# Patient Record
Sex: Female | Born: 1985 | Race: White | Hispanic: No | Marital: Married | Smoking: Never smoker
Health system: Southern US, Community
[De-identification: ages and names within clinical notes are randomized; demographics above are authoritative.]

## PROBLEM LIST (undated history)

## (undated) ENCOUNTER — Inpatient Hospital Stay (HOSPITAL_COMMUNITY): Payer: Self-pay

## (undated) DIAGNOSIS — N189 Chronic kidney disease, unspecified: Secondary | ICD-10-CM

## (undated) DIAGNOSIS — N200 Calculus of kidney: Secondary | ICD-10-CM

## (undated) HISTORY — PX: ECTOPIC PREGNANCY SURGERY: SHX613

---

## 2014-07-08 ENCOUNTER — Inpatient Hospital Stay (HOSPITAL_COMMUNITY)
Admission: AD | Admit: 2014-07-08 | Discharge: 2014-07-08 | Disposition: A | Discharge status: Home / Self Care | Payer: Medicaid Other | Source: Ambulatory Visit | Attending: Obstetrics & Gynecology | Admitting: Obstetrics & Gynecology

## 2014-07-08 ENCOUNTER — Encounter (HOSPITAL_COMMUNITY): Payer: Self-pay | Admitting: *Deleted

## 2014-07-08 ENCOUNTER — Inpatient Hospital Stay (HOSPITAL_COMMUNITY): Payer: Medicaid Other

## 2014-07-08 DIAGNOSIS — R109 Unspecified abdominal pain: Secondary | ICD-10-CM | POA: Insufficient documentation

## 2014-07-08 DIAGNOSIS — O209 Hemorrhage in early pregnancy, unspecified: Secondary | ICD-10-CM | POA: Diagnosis present

## 2014-07-08 DIAGNOSIS — O021 Missed abortion: Secondary | ICD-10-CM | POA: Diagnosis not present

## 2014-07-08 DIAGNOSIS — N189 Chronic kidney disease, unspecified: Secondary | ICD-10-CM | POA: Diagnosis not present

## 2014-07-08 HISTORY — DX: Calculus of kidney: N20.0

## 2014-07-08 HISTORY — DX: Chronic kidney disease, unspecified: N18.9

## 2014-07-08 LAB — URINALYSIS, ROUTINE W REFLEX MICROSCOPIC
BILIRUBIN URINE: NEGATIVE
Glucose, UA: NEGATIVE mg/dL
Ketones, ur: NEGATIVE mg/dL
Leukocytes, UA: NEGATIVE
Nitrite: NEGATIVE
Protein, ur: NEGATIVE mg/dL
SPECIFIC GRAVITY, URINE: 1.025 (ref 1.005–1.030)
Urobilinogen, UA: 0.2 mg/dL (ref 0.0–1.0)
pH: 6 (ref 5.0–8.0)

## 2014-07-08 LAB — URINE MICROSCOPIC-ADD ON

## 2014-07-08 LAB — WET PREP, GENITAL
CLUE CELLS WET PREP: NONE SEEN
TRICH WET PREP: NONE SEEN
WBC, Wet Prep HPF POC: NONE SEEN
YEAST WET PREP: NONE SEEN

## 2014-07-08 LAB — CBC
HEMATOCRIT: 39.7 % (ref 36.0–46.0)
Hemoglobin: 14 g/dL (ref 12.0–15.0)
MCH: 31 pg (ref 26.0–34.0)
MCHC: 35.3 g/dL (ref 30.0–36.0)
MCV: 87.8 fL (ref 78.0–100.0)
Platelets: 198 10*3/uL (ref 150–400)
RBC: 4.52 MIL/uL (ref 3.87–5.11)
RDW: 12.6 % (ref 11.5–15.5)
WBC: 6.7 10*3/uL (ref 4.0–10.5)

## 2014-07-08 LAB — ABO/RH: ABO/RH(D): O POS

## 2014-07-08 LAB — POCT PREGNANCY, URINE: PREG TEST UR: POSITIVE — AB

## 2014-07-08 LAB — HCG, QUANTITATIVE, PREGNANCY: hCG, Beta Chain, Quant, S: 5749 m[IU]/mL — ABNORMAL HIGH (ref <5)

## 2014-07-08 MED ORDER — PROMETHAZINE HCL 25 MG PO TABS: 25.0000 mg | ORAL_TABLET | Freq: Four times a day (QID) | ORAL | Status: DC | PRN

## 2014-07-08 MED ORDER — HYDROCODONE-ACETAMINOPHEN 5-325 MG PO TABS: 1.0000 | ORAL_TABLET | ORAL | Status: DC | PRN

## 2014-07-08 NOTE — MAU Note (Signed)
Pos HPT 3 weeks ago. [place] discharge started Saturday night,  Bleeding like a period today.  Mild cramping.  Hx of ectopic pregnancy.

## 2014-07-08 NOTE — MAU Provider Note (Signed)
History     CSN: [id number]  Arrival date and time: 07/08/14 1351   None     Chief Complaint  Patient presents with  . Vaginal Bleeding   HPI  Ms. Stephanie Marquez is a 28 y.o. female [phone] at [redacted]w[redacted]d who presents with vaginal bleeding that started 3 days ago. The bleeding started as [place] discharge, however changed to bright red bleeding today. She has had to wear and change a pad throughout the day. She is experiencing mild abdominal cramping that she describes as mild menstrual like cramps. She has a history of an ectopic pregnancy with partial salpingectomy.   OB History   Grav Para Term Preterm Abortions TAB SAB Ect Mult Living   4 2 2  1  1   2       Past Medical History  Diagnosis Date  . Chronic kidney disease   . Kidney stones     Past Surgical History  Procedure Laterality Date  . Ectopic pregnancy surgery      History reviewed. No pertinent family history.  History  Substance Use Topics  . Smoking status: Never Smoker   . Smokeless tobacco: Never Used  . Alcohol Use: No    Allergies:  Allergies  Allergen Reactions  . Bactrim [Sulfamethoxazole-Tmp Ds] Hives and Swelling    Prescriptions prior to admission  Medication Sig Dispense Refill  . Prenatal Vit-Fe Fumarate-FA (PRENATAL MULTIVITAMIN) TABS tablet Take 2 tablets by mouth daily.       Results for orders placed during the hospital encounter of 07/08/14 (from the past 48 hour(s))  POCT PREGNANCY, URINE     Status: Abnormal   Collection Time    07/08/14  2:56 PM      Result Value Ref Range   Preg Test, Ur POSITIVE (*) NEGATIVE   Comment:            THE SENSITIVITY OF THIS     METHODOLOGY IS >24 mIU/mL   Results for orders placed during the hospital encounter of 07/08/14 (from the past 48 hour(s))  URINALYSIS, ROUTINE W REFLEX MICROSCOPIC     Status: Abnormal   Collection Time    07/08/14  2:05 PM      Result Value Ref Range   Color, Urine YELLOW  YELLOW   APPearance CLEAR  CLEAR   Specific  Gravity, Urine 1.025  1.005 - 1.030   pH 6.0  5.0 - 8.0   Glucose, UA NEGATIVE  NEGATIVE mg/dL   Hgb urine dipstick LARGE (*) NEGATIVE   Bilirubin Urine NEGATIVE  NEGATIVE   Ketones, ur NEGATIVE  NEGATIVE mg/dL   Protein, ur NEGATIVE  NEGATIVE mg/dL   Urobilinogen, UA 0.2  0.0 - 1.0 mg/dL   Nitrite NEGATIVE  NEGATIVE   Leukocytes, UA NEGATIVE  NEGATIVE  URINE MICROSCOPIC-ADD ON     Status: None   Collection Time    07/08/14  2:05 PM      Result Value Ref Range   Squamous Epithelial / LPF RARE  RARE   RBC / HPF 21-50  <3 RBC/hpf   Bacteria, UA RARE  RARE  POCT PREGNANCY, URINE     Status: Abnormal   Collection Time    07/08/14  2:56 PM      Result Value Ref Range   Preg Test, Ur POSITIVE (*) NEGATIVE   Comment:            THE SENSITIVITY OF THIS     METHODOLOGY IS >24 mIU/mL  WET PREP,  GENITAL     Status: None   Collection Time    07/08/14  3:05 PM      Result Value Ref Range   Yeast Wet Prep HPF POC NONE SEEN  NONE SEEN   Trich, Wet Prep NONE SEEN  NONE SEEN   Clue Cells Wet Prep HPF POC NONE SEEN  NONE SEEN   WBC, Wet Prep HPF POC NONE SEEN  NONE SEEN  CBC     Status: None   Collection Time    07/08/14  3:08 PM      Result Value Ref Range   WBC 6.7  4.0 - 10.5 K/uL   RBC 4.52  3.87 - 5.11 MIL/uL   Hemoglobin 14.0  12.0 - 15.0 g/dL   HCT 16.1  09.6 - 04.5 %   MCV 87.8  78.0 - 100.0 fL   MCH 31.0  26.0 - 34.0 pg   MCHC 35.3  30.0 - 36.0 g/dL   RDW 40.9  81.1 - 91.4 %   Platelets 198  150 - 400 K/uL  ABO/RH     Status: None   Collection Time    07/08/14  3:08 PM      Result Value Ref Range   ABO/RH(D) O POS    HCG, QUANTITATIVE, PREGNANCY     Status: Abnormal   Collection Time    07/08/14  3:08 PM      Result Value Ref Range   hCG, Beta Chain, Quant, S 5749 (*) <5 mIU/mL   Comment:              GEST. AGE      CONC.  (mIU/mL)       <=1 WEEK        5 - 50         2 WEEKS       50 - 500         3 WEEKS       100 - 10,000         4 WEEKS     1,000 - 30,000          5 WEEKS     3,500 - 115,000       6-8 WEEKS     12,000 - 270,000        12 WEEKS     15,000 - 220,000                FEMALE AND NON-PREGNANT FEMALE:         LESS THAN 5 mIU/mL   US Ob Comp Less 14 Wks  07/08/2014   CLINICAL DATA:  28 year old female with bleeding in early pregnancy. Quantitative beta HCG 5,749. Estimated gestational age by LMP 9 weeks 5 days. Initial encounter.  EXAM: OBSTETRIC <14 WK [place] AND TRANSVAGINAL OB US  TECHNIQUE: Both transabdominal and transvaginal ultrasound examinations were performed for complete evaluation of the gestation as well as the maternal uterus, adnexal regions, and pelvic cul-de-sac. Transvaginal technique was performed to assess early pregnancy.  COMPARISON:  None.  FINDINGS: Intrauterine gestational sac: Single  Yolk sac:  Not visible  Embryo:  Visible  Cardiac Activity: Not detected  CRL:   12.7  mm   7 w 4 d  Maternal uterus/adnexae: No pelvic free fluid or subchorionic hemorrhage identified. Probable corpus luteum cyst of the right ovary (image 57) which otherwise appears normal. Normal left ovary with small follicles.  IMPRESSION: Single IUP with estimated gestational age of [redacted] weeks  and 4 days by crown-rump length (12.7 mm), but no fetal cardiac activity detected. Findings meet definitive criteria for failed pregnancy. This follows SRU consensus guidelines: Diagnostic Criteria for Nonviable Pregnancy Early in the First Trimester. Macy Mis J Med [phone].   Electronically Signed   By: Augusto Gamble M.D.   On: 07/08/2014 16:03   US Ob Transvaginal  07/08/2014   CLINICAL DATA:  28 year old female with bleeding in early pregnancy. Quantitative beta HCG 5,749. Estimated gestational age by LMP 9 weeks 5 days. Initial encounter.  EXAM: OBSTETRIC <14 WK [place] AND TRANSVAGINAL OB US  TECHNIQUE: Both transabdominal and transvaginal ultrasound examinations were performed for complete evaluation of the gestation as well as the maternal uterus, adnexal regions, and pelvic  cul-de-sac. Transvaginal technique was performed to assess early pregnancy.  COMPARISON:  None.  FINDINGS: Intrauterine gestational sac: Single  Yolk sac:  Not visible  Embryo:  Visible  Cardiac Activity: Not detected  CRL:   12.7  mm   7 w 4 d  Maternal uterus/adnexae: No pelvic free fluid or subchorionic hemorrhage identified. Probable corpus luteum cyst of the right ovary (image 57) which otherwise appears normal. Normal left ovary with small follicles.  IMPRESSION: Single IUP with estimated gestational age of [redacted] weeks and 4 days by crown-rump length (12.7 mm), but no fetal cardiac activity detected. Findings meet definitive criteria for failed pregnancy. This follows SRU consensus guidelines: Diagnostic Criteria for Nonviable Pregnancy Early in the First Trimester. Macy Mis J Med [phone].   Electronically Signed   By: Augusto Gamble M.D.   On: 07/08/2014 16:03    Review of Systems  Constitutional: Negative for fever and chills.  Gastrointestinal: Positive for abdominal pain (Lower abdominal cramping ). Negative for nausea, vomiting, diarrhea and constipation.  Genitourinary: Negative for dysuria, urgency, frequency and hematuria.       No vaginal discharge. + vaginal bleeding; period like bleeding  No dysuria.   Musculoskeletal: Negative for back pain.   Physical Exam   Blood pressure 104/74, pulse 88, temperature 98.4 F (36.9 C), temperature source Oral, resp. rate 18, last menstrual period 05/01/2014.  Physical Exam  Constitutional: She is oriented to person, place, and time. She appears well-developed and well-nourished. No distress.  HENT:  Head: Normocephalic.  Eyes: Pupils are equal, round, and reactive to light.  Neck: Neck supple.  Respiratory: Effort normal.  GI: Soft. She exhibits no distension. There is no tenderness. There is no rebound and no guarding.  Genitourinary:  Speculum exam: Vagina - Moderate amount of dark red blood in vaginal canal.  Cervix - +active  bleeding from cervix.  Bimanual exam: Cervix slightly open. Uterus non tender, enlarged  Adnexa non tender, no masses bilaterally GC/Chlam, wet prep done Chaperone present for exam.   Musculoskeletal: Normal range of motion.  Neurological: She is alert and oriented to person, place, and time.  Skin: Skin is warm. She is not diaphoretic.  Psychiatric: Her behavior is normal.    MAU Course  Procedures None  MDM CBC ABO Beta hcg [place] Wet prep GC  Discussed findings with Dr. Debroah Loop. I will offer patient cytotec vs expectant management.  Pt will do expectant management and follow up at [facility] in 1 week. Pt is aware that she may require further intervention.  O positive blood type   Assessment and Plan   A:  Missed AB  P:  Discharge home in stable condition RX: Vicodin         Phenergan  Bleeding precautions discussed Pt is to schedule a follow up appointment for 1 week. Pelvic rest  Support given    Iona HansenJennifer Irene Neng Albee, NP 07/08/2014, 2:51 PM

## 2014-07-09 LAB — GC/CHLAMYDIA PROBE AMP
CT PROBE, AMP APTIMA: NEGATIVE
GC Probe RNA: NEGATIVE

## 2014-07-16 ENCOUNTER — Ambulatory Visit (INDEPENDENT_AMBULATORY_CARE_PROVIDER_SITE_OTHER): Payer: Medicaid Other | Admitting: Obstetrics & Gynecology

## 2014-07-16 ENCOUNTER — Encounter: Payer: Self-pay | Admitting: Obstetrics & Gynecology

## 2014-07-16 VITALS — BP 113/77 | HR 83 | Ht 61.0 in | Wt 143.6 lb

## 2014-07-16 DIAGNOSIS — O039 Complete or unspecified spontaneous abortion without complication: Secondary | ICD-10-CM

## 2014-07-16 NOTE — Progress Notes (Signed)
Patient bled up until two days ago.  She did bleed a moderate amount of blood with pretty severe cramping in her back.  She was seen in MAU last week and was told to follow up with us.  She opted for expectant management.

## 2014-07-16 NOTE — Progress Notes (Signed)
Diagnosed with 7 week MAB on 07/08/14, opted for expectant management.  Had moderate bleeding, feels that she passed the pregnancy.  No concerns. Appropriate support and empathy given to patient.  Completed SAB confirmed on clinic scan on 07/16/14. Declines contraception for now; uses rhythm method. Will continue to use PNV and avoid teratogens, plans to conceive in 2-3 months. Has appropriate support from family and church members; declines any additional help No other concerns. Will call for any concerns or when she conceives again.

## 2014-07-16 NOTE — Patient Instructions (Signed)
Return to clinic for any obstetric concerns or go to MAU for evaluation  

## 2014-09-24 IMAGING — US US OB COMP LESS 14 WK
1 series · 13 of 28 positions shown · non-contrast
Comparison: None.

CLINICAL DATA: 28-year-old female with bleeding in early pregnancy.
Quantitative beta HCG [DATE]. Estimated gestational age by LMP 9
weeks 5 days. Initial encounter.

EXAM:
OBSTETRIC <14 WK US AND TRANSVAGINAL OB US
TECHNIQUE: Both transabdominal and transvaginal ultrasound examinations were
performed for complete evaluation of the gestation as well as the
maternal uterus, adnexal regions, and pelvic cul-de-sac.
Transvaginal technique was performed to assess early pregnancy.

[Series 1: us ob comp less 14 wks · 56 acquisitions, 13 frames shown]
[im 3/56]
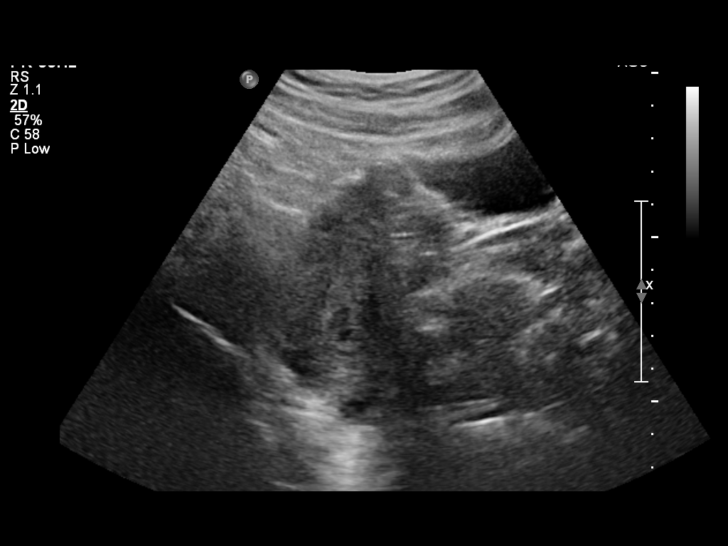
[im 7/56]
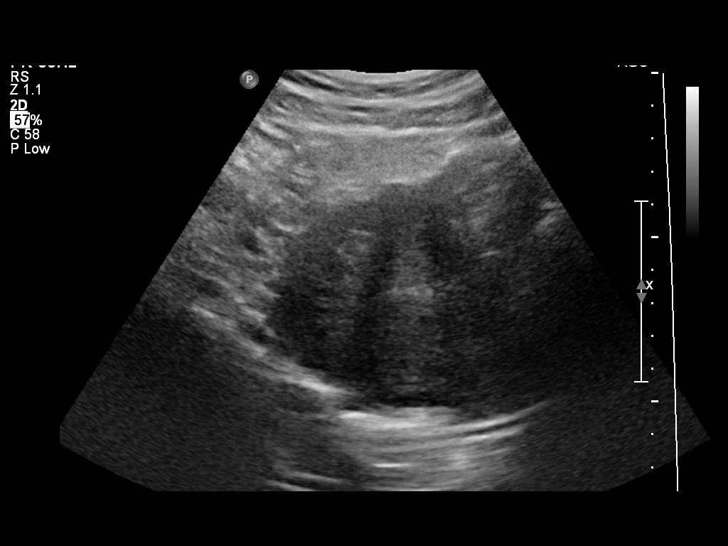
[im 11/56]
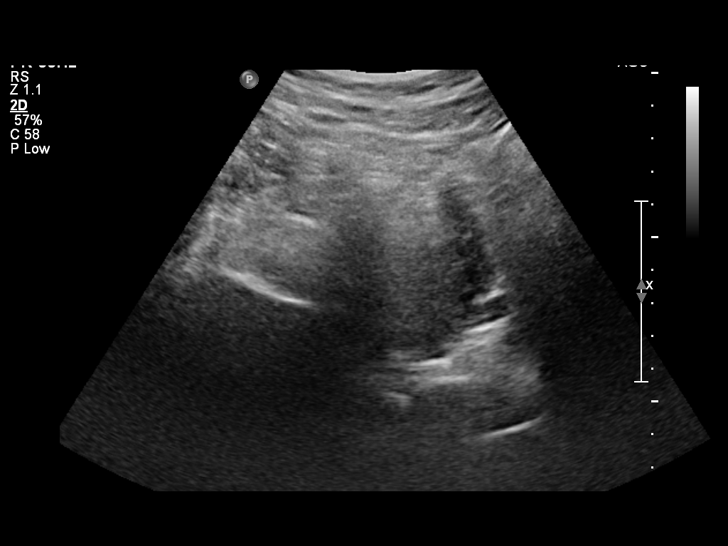
[im 15/56]
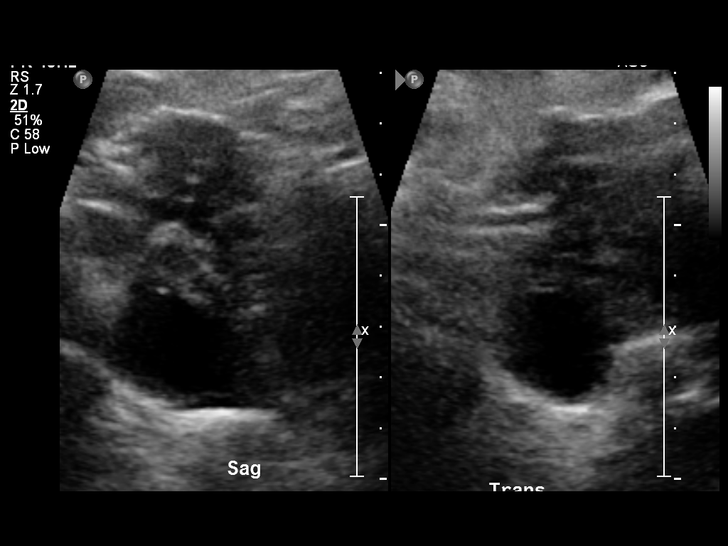
[im 19/56]
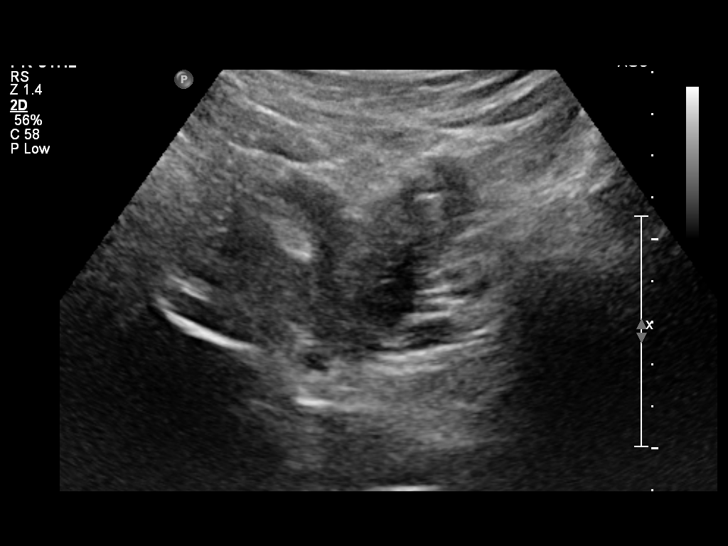
[im 23/56]
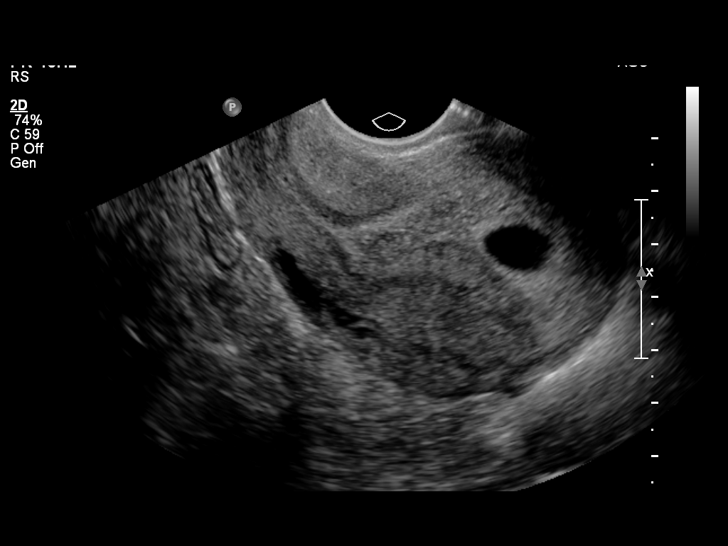
[im 29/56]
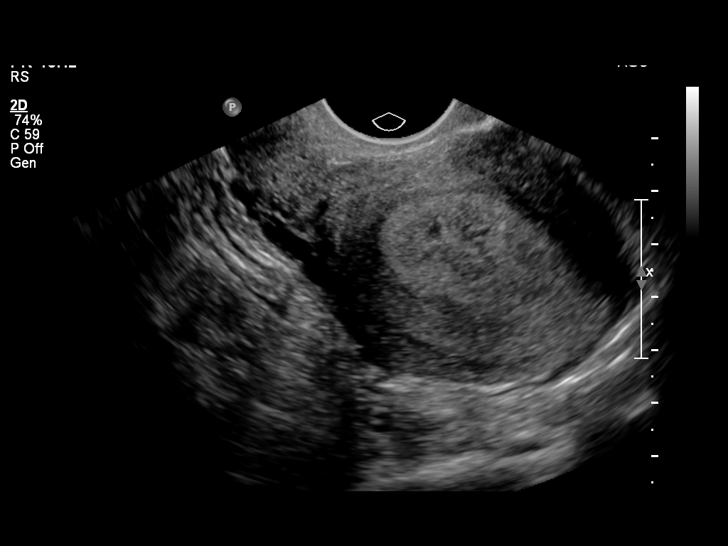
[im 33/56]
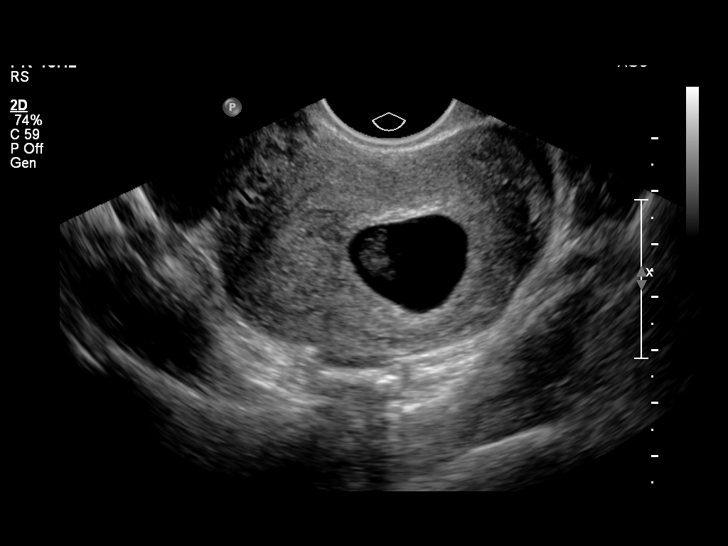
[im 37/56]
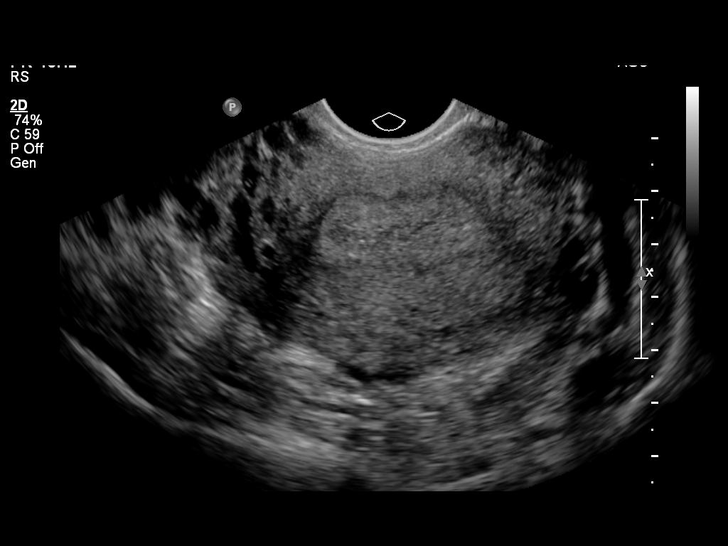
[im 41/56]
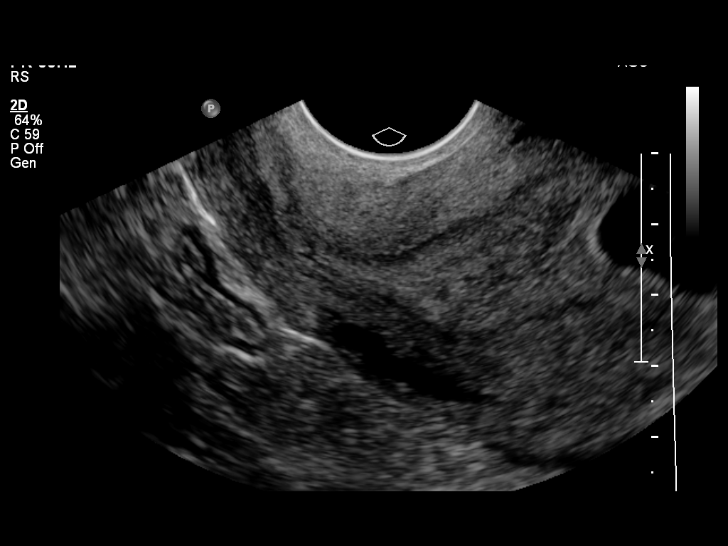
[im 45/56]
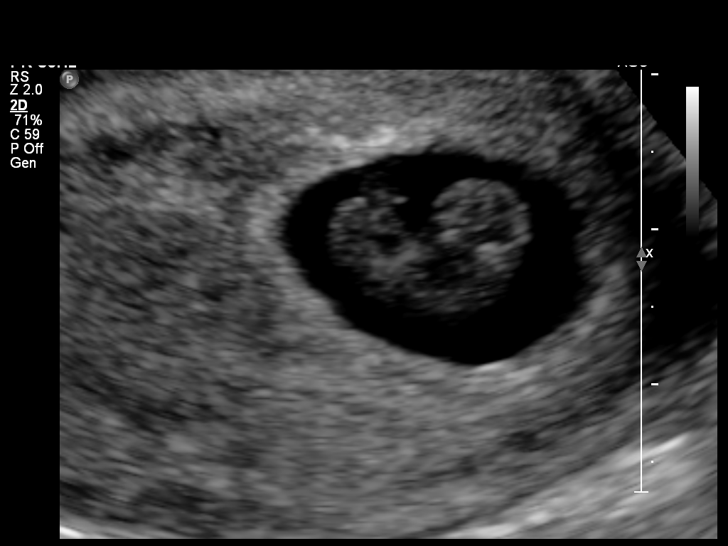
[im 49/56]
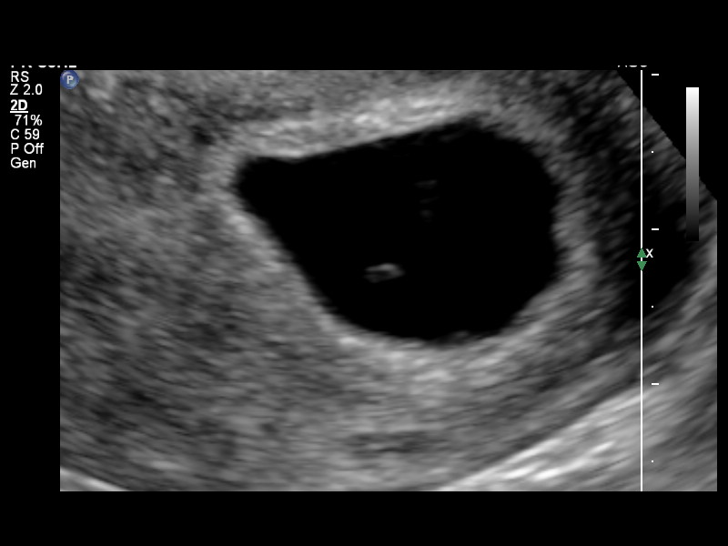
[im 53/56]
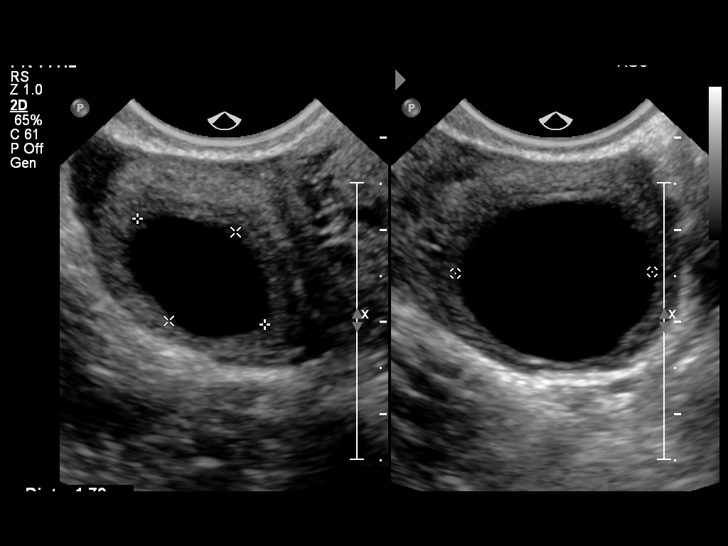

[13 of 28 positions shown; findings below may reference images not displayed]

FINDINGS: Intrauterine gestational sac: Single

Yolk sac:  Not visible

Embryo:  Visible

Cardiac Activity: Not detected

CRL:   12.7  mm   7 w 4 d

Maternal uterus/adnexae: No pelvic free fluid or subchorionic
hemorrhage identified. Probable corpus luteum cyst of the right
ovary (image 57) which otherwise appears normal. Normal left ovary
with small follicles.
IMPRESSION: Single IUP with estimated gestational age of 7 weeks and 4 days by
crown-rump length (12.7 mm), but no fetal cardiac activity detected.
Findings meet definitive criteria for failed pregnancy. This follows
SRU consensus guidelines: Diagnostic Criteria for Nonviable
Pregnancy Early in the First Trimester. N Engl J Med

## 2014-10-01 ENCOUNTER — Encounter: Payer: Self-pay | Admitting: Obstetrics & Gynecology

## 2015-04-17 ENCOUNTER — Encounter: Payer: Self-pay | Admitting: Obstetrics and Gynecology

## 2015-04-17 ENCOUNTER — Ambulatory Visit (INDEPENDENT_AMBULATORY_CARE_PROVIDER_SITE_OTHER): Payer: Medicaid Other | Admitting: Obstetrics and Gynecology

## 2015-04-17 ENCOUNTER — Other Ambulatory Visit (HOSPITAL_COMMUNITY)
Admission: RE | Admit: 2015-04-17 | Discharge: 2015-04-17 | Disposition: A | Discharge status: Home / Self Care | Payer: Medicaid Other | Source: Ambulatory Visit | Attending: Obstetrics and Gynecology | Admitting: Obstetrics and Gynecology

## 2015-04-17 VITALS — BP 118/76 | HR 114 | Ht 61.0 in | Wt 148.0 lb

## 2015-04-17 DIAGNOSIS — Z0000 Encounter for general adult medical examination without abnormal findings: Secondary | ICD-10-CM | POA: Diagnosis not present

## 2015-04-17 DIAGNOSIS — Z01419 Encounter for gynecological examination (general) (routine) without abnormal findings: Secondary | ICD-10-CM | POA: Insufficient documentation

## 2015-04-17 DIAGNOSIS — Z124 Encounter for screening for malignant neoplasm of cervix: Secondary | ICD-10-CM

## 2015-04-17 NOTE — Progress Notes (Signed)
**Note Stephanie-Identified via Obfuscation**   Subjective:     Stephanie Marquez is a 29 y.o. female [phone] with LMP 04/12/2015 and BMI 27 who is here for a comprehensive physical exam. The patient reports no problems. Patient is sexually active using the rhythm method. She recently lost her mother to ovarian cancer in April 2016. She was diagnosed at the age of 34. No other family history of malignancy.  History   Social History  . Marital Status: Married    Spouse Name: N/A  . Number of Children: N/A  . Years of Education: N/A   Occupational History  . Not on file.   Social History Main Topics  . Smoking status: Never Smoker   . Smokeless tobacco: Never Used  . Alcohol Use: No  . Drug Use: No  . Sexual Activity: Yes    Birth Control/ Protection: None   Other Topics Concern  . Not on file   Social History Narrative   Health Maintenance  Topic Date Due  . HIV Screening  03/15/2001  . PAP SMEAR  03/15/2004  . TETANUS/TDAP  03/15/2005  . INFLUENZA VACCINE  06/30/2015   Past Medical History  Diagnosis Date  . Chronic kidney disease   . Kidney stones    Past Surgical History  Procedure Laterality Date  . Ectopic pregnancy surgery     History reviewed. No pertinent family history.    Review of Systems Pertinent items are noted in HPI.   Objective:      GENERAL: Well-developed, well-nourished female in no acute distress.  HEENT: Normocephalic, atraumatic. Sclerae anicteric.  NECK: Supple. Normal thyroid.  LUNGS: Clear to auscultation bilaterally.  HEART: Regular rate and rhythm. BREASTS: Symmetric in size. No palpable masses or lymphadenopathy, skin changes, or nipple drainage. ABDOMEN: Soft, nontender, nondistended. No organomegaly. PELVIC: Normal external female genitalia. Vagina is pink and rugated.  Normal discharge. Normal appearing cervix. Uterus is normal in size. No adnexal mass or tenderness. EXTREMITIES: No cyanosis, clubbing, or edema, 2+ distal pulses.    Assessment:    Healthy female exam.       Plan:    Pap smear collected Patient advised to start monthly self breast exam Patient will be contacted with any abnormal results Information on BRCA testing provided although patient is low risk See After Visit Summary for Counseling Recommendations

## 2015-04-21 LAB — CYTOLOGY - PAP

## 2016-11-30 ENCOUNTER — Ambulatory Visit (INDEPENDENT_AMBULATORY_CARE_PROVIDER_SITE_OTHER): Payer: Self-pay | Admitting: Certified Nurse Midwife

## 2016-11-30 ENCOUNTER — Encounter: Payer: Self-pay | Admitting: Certified Nurse Midwife

## 2016-11-30 VITALS — BP 79/46 | HR 88 | Ht 61.0 in | Wt 126.3 lb

## 2016-11-30 DIAGNOSIS — N926 Irregular menstruation, unspecified: Secondary | ICD-10-CM

## 2016-11-30 DIAGNOSIS — Z3201 Encounter for pregnancy test, result positive: Secondary | ICD-10-CM

## 2016-11-30 LAB — POCT URINE PREGNANCY: Preg Test, Ur: POSITIVE — AB

## 2016-11-30 NOTE — Patient Instructions (Signed)

## 2016-11-30 NOTE — Progress Notes (Signed)
GYN ENCOUNTER NOTE  Subjective:       Stephanie Marquez is a 31 y.o. [phone] female is here for gynecologic evaluation of the following issues: positive home pregnancy test and missed menses.   She reports morning sickness symptoms consistent with her two previous pregnancy; nausea and occasional vomiting that does not require medications.   [place] also has experienced some general fatigue and breast tenderness, but she is not sure if the fatigue is related to pregnancy or raising two small children.   Delynn, her husband, and her two small children (six and four) relocated from [place] for her husband job with [employer]   They are excited about this pregnancy and midwifery care.    Gynecologic History Patient's last menstrual period was 10/04/2016 (exact date). Menstrual cycles are regular.   EDD: 07/11/2017.  Gestational age:  8 weeks 1 day  Contraception: none   Last Pap: 03/2015. Results were: normal   Obstetric History OB History  Gravida Para Term Preterm AB Living  6 2 2   1 2   SAB TAB Ectopic Multiple Live Births  0   1   2    # Outcome Date GA Lbr Len/2nd Weight Sex Delivery Anes PTL Lv  6 Current           5 Term 11/30/12    Genella Mech EPI  LIV  4 Ectopic 2013          3 Term 08/13/10    M Vag-Spont EPI  LIV  2 Gravida           1 Gravida               Past Medical History:  Diagnosis Date  . Chronic kidney disease   . Kidney stones     Past Surgical History:  Procedure Laterality Date  . ECTOPIC PREGNANCY SURGERY      No current outpatient prescriptions on file prior to visit.   No current facility-administered medications on file prior to visit.     Allergies  Allergen Reactions  . Bactrim [Sulfamethoxazole-Trimethoprim] Hives and Swelling    Social History   Social History  . Marital status: Married    Spouse name: N/A  . Number of children: N/A  . Years of education: N/A   Occupational History  . Not on file.   Social History  Main Topics  . Smoking status: Never Smoker  . Smokeless tobacco: Never Used  . Alcohol use No  . Drug use: No  . Sexual activity: Yes    Birth control/ protection: None   Other Topics Concern  . Not on file   Social History Narrative  . No narrative on file    Family History  Problem Relation Age of Onset  . Ovarian cancer Mother   . Rheum arthritis Father     The following portions of the patient's history were reviewed and updated as appropriate: allergies, current medications, past family history, past medical history, past social history, past surgical history and problem list.  Review of Systems Review of Systems - negative except for as noted above  Objective:   BP (!) 79/46   Pulse 88   Ht 5\' 1"  (1.549 m)   Wt 126 lb 5 oz (57.3 kg)   LMP 10/04/2016 (Exact Date)   BMI 23.87 kg/m  CONSTITUTIONAL: Well-developed female in no acute distress.   LUNGS: CTAB  HEART: RRR  ABDOMEN: Soft, round, and non-tender  SKIN: clean, dry, and intact. No  rashes or lesions noted   Assessment:   1. Positive pregnancy test  - Beta HCG, Quant - US OB Comp Less 14 Wks; Future - POCT urine pregnancy  2. Missed menses  - Beta HCG, Quant - US OB Comp Less 14 Wks; Future - POCT urine pregnancy     Plan:   RTC x 1-2 weeks for viability US and nurse intake.  RTC sooner if needed.  Reviewed red flag symptoms and reasons to call.  Continue taking PNV w/folic acid and DHA.    Gunnar BullaJenkins Michelle Nikkole Placzek, CNM

## 2016-11-30 NOTE — Progress Notes (Signed)
Pt had a positive home pregnancy test. Also has a history of ectopic right side 2013, miscarriage at 10 weeks in 2016.C/o n/v

## 2016-12-01 LAB — BETA HCG QUANT (REF LAB): hCG Quant: 46444 m[IU]/mL

## 2016-12-03 NOTE — Addendum Note (Signed)
Addended by: Shaune SpittleLAWHORN, Antoinette Haskett M on: 12/03/2016 08:07 AM   Modules accepted: Orders

## 2016-12-07 ENCOUNTER — Ambulatory Visit (INDEPENDENT_AMBULATORY_CARE_PROVIDER_SITE_OTHER): Payer: Self-pay

## 2016-12-07 ENCOUNTER — Ambulatory Visit (INDEPENDENT_AMBULATORY_CARE_PROVIDER_SITE_OTHER): Payer: Self-pay | Admitting: Certified Nurse Midwife

## 2016-12-07 ENCOUNTER — Encounter: Payer: Self-pay | Admitting: Certified Nurse Midwife

## 2016-12-07 ENCOUNTER — Other Ambulatory Visit: Payer: Self-pay | Admitting: Certified Nurse Midwife

## 2016-12-07 VITALS — BP 110/68 | HR 71 | Wt 146.2 lb

## 2016-12-07 DIAGNOSIS — Z1389 Encounter for screening for other disorder: Secondary | ICD-10-CM

## 2016-12-07 DIAGNOSIS — N926 Irregular menstruation, unspecified: Secondary | ICD-10-CM

## 2016-12-07 DIAGNOSIS — Z369 Encounter for antenatal screening, unspecified: Secondary | ICD-10-CM

## 2016-12-07 DIAGNOSIS — Z113 Encounter for screening for infections with a predominantly sexual mode of transmission: Secondary | ICD-10-CM

## 2016-12-07 DIAGNOSIS — R935 Abnormal findings on diagnostic imaging of other abdominal regions, including retroperitoneum: Secondary | ICD-10-CM

## 2016-12-07 DIAGNOSIS — Z3201 Encounter for pregnancy test, result positive: Secondary | ICD-10-CM

## 2016-12-07 DIAGNOSIS — Z3481 Encounter for supervision of other normal pregnancy, first trimester: Secondary | ICD-10-CM

## 2016-12-07 NOTE — Progress Notes (Signed)
Patient ID: Stephanie RogerKaty Marquez, female   DOB: [birth year], 31 y.o.   MRN: [id number]  Pt presents for ultrasound for viability and dating before her NOB Nurse Intake visit. Ultrasound results showed GA-6 wks, no fht, with irregular, thickened tissue surrounding pregnancy. BHCG: 46,444 on 11/30/2016 (6-8wks)  and had positive pregnancy test. Pt will see MJL today to discuss ultrasound results.

## 2016-12-07 NOTE — Progress Notes (Signed)
ULTRASOUND REPORT  Location: [facility] Date of Service: 12/07/16  Indications:Viability Findings:  Mason JimSingleton intrauterine pregnancy is visualized with a CRL consistent with 6 1/[redacted] weeks gestation, giving an (U/S) EDD of 08/01/17. The (U/S) EDD is NOT consistent with the clinically established (LMP) EDD of 07/11/17.  FHR: no Heart beat seen CRL measurement: 4.2 mm Yolk sac and and early anatomy is abnormal.  Tissue surrounding gestational sac appears thickened and abnormal.   Right Ovary measures 2.4 x 1.2 x 1.3 cm. It is normal in appearance. Left Ovary measures 4.5 x 3.2 x 3.5 cm. Simple Cyst on left ovary 3 x 2.4 x 3.1 cm. There is evidence of a corpus luteal cyst in the Left Survey of the adnexa demonstrates no adnexal masses. There is no free peritoneal fluid in the cul de sac.  Impression: 1. 6 1/7  week Singleton Intrauterine pregnancy by U/S. 2. (U/S) EDD is NOT consistent with Clinically established (LMP) EDD of 07/11/17.  Recommendations: 1.Clinical correlation with the patient's History and Physical Exam.  Boyce MediciMaria E Hill   Plan: 1. US findings reviewed with pt, verbalized understanding.   2. Will draw a repeat Beta HCG today (12/07/2016) and f/u with patient if repeat US needed.   3. Reviewed bleeding precautions and when to call   Gunnar BullaJenkins Michelle Mi Balla, CNM  A total of 10 minutes were spent face-to-face with the patient during this encounter and over half of that time dealt with counseling and coordination of care.

## 2016-12-08 LAB — ANTIBODY SCREEN: ANTIBODY SCREEN: NEGATIVE

## 2016-12-08 LAB — BETA HCG QUANT (REF LAB): hCG Quant: 51882 m[IU]/mL

## 2016-12-08 LAB — RH TYPE: Rh Factor: POSITIVE

## 2016-12-08 LAB — ABO

## 2016-12-09 ENCOUNTER — Telehealth: Payer: Self-pay | Admitting: Certified Nurse Midwife

## 2016-12-09 DIAGNOSIS — N926 Irregular menstruation, unspecified: Secondary | ICD-10-CM

## 2016-12-09 NOTE — Telephone Encounter (Signed)
Called pt, verified full name and date of birth.   Lab results given. Discussed expected outcome vs actual outcome.   Repeat beta HCG and US on 12/14/2016.  Bleeding precautions given, pt verbalized understanding.   Call back with further needs, questions, or concerns.    Gunnar BullaJenkins Michelle Jameika Kinn, CNM

## 2016-12-14 ENCOUNTER — Other Ambulatory Visit: Payer: Self-pay | Admitting: Certified Nurse Midwife

## 2016-12-14 ENCOUNTER — Ambulatory Visit (INDEPENDENT_AMBULATORY_CARE_PROVIDER_SITE_OTHER): Payer: Self-pay

## 2016-12-14 ENCOUNTER — Ambulatory Visit (INDEPENDENT_AMBULATORY_CARE_PROVIDER_SITE_OTHER): Payer: Self-pay | Admitting: Certified Nurse Midwife

## 2016-12-14 ENCOUNTER — Encounter: Payer: Self-pay | Admitting: Certified Nurse Midwife

## 2016-12-14 VITALS — BP 88/46 | HR 84 | Wt 146.4 lb

## 2016-12-14 DIAGNOSIS — N926 Irregular menstruation, unspecified: Secondary | ICD-10-CM

## 2016-12-14 DIAGNOSIS — O021 Missed abortion: Secondary | ICD-10-CM

## 2016-12-14 DIAGNOSIS — Z3201 Encounter for pregnancy test, result positive: Secondary | ICD-10-CM

## 2016-12-14 LAB — POCT URINALYSIS DIPSTICK
Bilirubin, UA: NEGATIVE
GLUCOSE UA: NEGATIVE
Ketones, UA: NEGATIVE
Leukocytes, UA: NEGATIVE
NITRITE UA: NEGATIVE
PROTEIN UA: NEGATIVE
SPEC GRAV UA: 1.015
UROBILINOGEN UA: NEGATIVE
pH, UA: 6.5

## 2016-12-14 MED ORDER — MISOPROSTOL 200 MCG PO TABS: ORAL_TABLET | ORAL | 1 refills | Status: DC

## 2016-12-14 NOTE — Progress Notes (Signed)
OB/GYN CONFERENCE NOTE:  Subjective:       Stephanie Marquez is a 31 y.o. [phone] female who presents for review of repeat US and lab work.   Pt seen two (2) weeks ago for evaluation of missed menses and positive home pregnancy test. Estreya was scheduled for a dating and viability [place] and Beta HCG due to her history of miscarriage.   Initial US revealed a gestational sac dating 6 weeks 1 day with abnormal tissue surrounding and no fetal heart beat. Beta HCG showed less than adequate increase.   Results reviewed with patient and decision was made to repeating testing x 1 week.   Denies difficulty breathing or respiratory distress, fever, chest pain, abdominal pain, vaginal bleeding, and leg pain or swelling.   Gynecologic History  Patient's last menstrual period was 10/04/2016 (exact date).   Contraception: none  Obstetric History OB History  Gravida Para Term Preterm AB Living  6 2 2   1 2   SAB TAB Ectopic Multiple Live Births  0   1   2    # Outcome Date GA Lbr Len/2nd Weight Sex Delivery Anes PTL Lv  6 Current           5 Term 11/30/12    Stephanie Marquez EPI  LIV  4 Ectopic 2013          3 Term 08/13/10    M Vag-Spont EPI  LIV  2 Gravida           1 Gravida               Past Medical History:  Diagnosis Date  . Chronic kidney disease   . Kidney stones     Past Surgical History:  Procedure Laterality Date  . ECTOPIC PREGNANCY SURGERY      Current Outpatient Prescriptions on File Prior to Visit  Medication Sig Dispense Refill  . Prenatal Vit-Fe Fumarate-FA (PRENATAL MULTIVITAMIN) TABS tablet Take 1 tablet by mouth daily at 12 noon.     No current facility-administered medications on file prior to visit.     Allergies  Allergen Reactions  . Bactrim [Sulfamethoxazole-Trimethoprim] Hives and Swelling    Social History   Social History  . Marital status: Married    Spouse name: N/A  . Number of children: N/A  . Years of education: N/A   Occupational History  . Not  on file.   Social History Main Topics  . Smoking status: Never Smoker  . Smokeless tobacco: Never Used  . Alcohol use No  . Drug use: No  . Sexual activity: Yes    Birth control/ protection: None   Other Topics Concern  . Not on file   Social History Narrative  . No narrative on file    Family History  Problem Relation Age of Onset  . Ovarian cancer Mother   . Rheum arthritis Father     The following portions of the patient's history were reviewed and updated as appropriate: allergies, current medications, past family history, past medical history, past social history, past surgical history and problem list.  Review of Systems  Pertinent items are noted in HPI.   Objective:   BP (!) 88/46   Pulse 84   Wt 146 lb 6 oz (66.4 kg)   LMP 10/04/2016 (Exact Date)   BMI 27.66 kg/m   ULTRASOUND REPORT  Location: [facility] Date of Service: 12/14/16  Indications:Threatened AB Findings:  Singleton intrauterine pregnancy is visualized with a CRL consistent  with 6 1/[redacted] weeks gestation, giving an (U/S) EDD of 08/08/17. The (U/S) EDD is NOT consistent with the clinically established (LMP) EDD of 07/11/17.  Pregnancy appear unchanged since scan on 12-07-16.  Possible missed abortion.  FHR: no heart beat seen CRL measurement: 4 mm Yolk sac and and early anatomy is abnormal in appearance.  Right Ovary measures 2.3 x 1.1 x 1 cm. It is normal in appearance. Left Ovary measures 4.1 x 2.8 x 3.9 cm. Simple cyst on left ovary measures 3 x 2.6 x 3.1 cm. There is evidence of a corpus luteal cyst in the Left Survey of the adnexa demonstrates no adnexal masses. There is no free peritoneal fluid in the cul de sac.  Impression: 1. 6 1/7 week NON-Viable Singleton Intrauterine pregnancy by U/S. 2. (U/S) EDD is NOT consistent with Clinically established (LMP) EDD of 07/11/17. 3.  Abnormal appearing pregnancy  Assessment   Missed abortion   Plan   1. Reviewed US report and  treatment options as listed below:   SURGERY (D&E) . Procedure over in 1 day . Requires being put to sleep . Bleeding may be light . Possible problems during surgery, including injury to womb(uterus) . Care provider has more control Medicine (CYTOTEC) . The complete procedure may take days to weeks . No Surgery . Bleeding may be heavy at times . There may be drug side effects . Patient has more control Waiting . You may choose to wait, in which case your own body may complete the passing of the abnormal early pregnancy on its own in about 2-4 weeks . Your bleeding may be heavy at times . There is a small possibility that you may need surgery if the bleeding is too much or not all of the pregnancy has passed.  2. Repeat US and Beta HCG x 1 week.  3. After discussing treatment options, Stephanie Marquez would like to wait for the pregnancy to pass without intervention. Strict instructions on temperature monitoring and when to call given, pt verbalized understanding.   4. Misoprostol Rx to pharmacy, pt to call if she decides to start taking the medication.  5. Condolences given   Stephanie Marquez Stephanie Marquez, CNM

## 2016-12-14 NOTE — Progress Notes (Signed)
Pt is here to followup on labs and U/S.

## 2016-12-15 LAB — BETA HCG QUANT (REF LAB): HCG QUANT: 33797 m[IU]/mL

## 2016-12-21 ENCOUNTER — Ambulatory Visit (INDEPENDENT_AMBULATORY_CARE_PROVIDER_SITE_OTHER): Payer: Self-pay

## 2016-12-21 ENCOUNTER — Ambulatory Visit (INDEPENDENT_AMBULATORY_CARE_PROVIDER_SITE_OTHER): Payer: Self-pay | Admitting: Certified Nurse Midwife

## 2016-12-21 VITALS — BP 116/78 | HR 81 | Ht 61.0 in | Wt 145.4 lb

## 2016-12-21 DIAGNOSIS — O021 Missed abortion: Secondary | ICD-10-CM

## 2016-12-21 DIAGNOSIS — N926 Irregular menstruation, unspecified: Secondary | ICD-10-CM

## 2016-12-21 NOTE — Progress Notes (Signed)
GYN ENCOUNTER NOTE  Subjective:       Stephanie Marquez is a 31 y.o. [phone] female who presents for review of repeat ultrasound.   Pt seen last week for repeat US and blood work due to non viable pregnancy and missed ab. Stephanie Marquez opted for expected management after all options were discussed.   Denies difficulty breathing or respiratory distress, fever, chest pain, abdominal pain, vaginal bleeding, and leg pain or swelling.    Gynecologic History  Patient's last menstrual period was 10/04/2016 (exact date).   Contraception: none  Obstetric History OB History  Gravida Para Term Preterm AB Living  6 2 2   1 2   SAB TAB Ectopic Multiple Live Births  0   1   2    # Outcome Date GA Lbr Len/2nd Weight Sex Delivery Anes PTL Lv  6 Current           5 Term 11/30/12    Genella MechM Vag-Spont EPI  LIV  4 Ectopic 2013          3 Term 08/13/10    M Vag-Spont EPI  LIV  2 Gravida           1 Gravida               Past Medical History:  Diagnosis Date  . Chronic kidney disease   . Kidney stones     Past Surgical History:  Procedure Laterality Date  . ECTOPIC PREGNANCY SURGERY      Current Outpatient Prescriptions on File Prior to Visit  Medication Sig Dispense Refill  . misoprostol (CYTOTEC) 200 MCG tablet Place four tablets in between your gums and cheeks (two tablets on each side) as instructed OR insert four tablets vaginally 4 tablet 1  . Prenatal Vit-Fe Fumarate-FA (PRENATAL MULTIVITAMIN) TABS tablet Take 1 tablet by mouth daily at 12 noon.     No current facility-administered medications on file prior to visit.     Allergies  Allergen Reactions  . Bactrim [Sulfamethoxazole-Trimethoprim] Hives and Swelling    Social History   Social History  . Marital status: Married    Spouse name: N/A  . Number of children: N/A  . Years of education: N/A   Occupational History  . Not on file.   Social History Main Topics  . Smoking status: Never Smoker  . Smokeless tobacco: Never Used  .  Alcohol use No  . Drug use: No  . Sexual activity: Yes    Birth control/ protection: None   Other Topics Concern  . Not on file   Social History Narrative  . No narrative on file    Family History  Problem Relation Age of Onset  . Ovarian cancer Mother   . Rheum arthritis Father     The following portions of the patient's history were reviewed and updated as appropriate: allergies, current medications, past family history, past medical history, past social history, past surgical history and problem list.  Review of Systems  Review of Systems - Negative except as mentioned above History obtained from the patient    Objective:   BP 116/78 (BP Location: Left Arm, Patient Position: Sitting, Cuff Size: Normal)   Pulse 81   Ht 5\' 1"  (1.549 m)   Wt 145 lb 6.4 oz (66 kg)   LMP 10/04/2016 (Exact Date)   BMI 27.47 kg/m    ULTRASOUND REPORT  Location: [facility] Date of Service: 12/21/16  Indications:Threatened AB Findings:  Singleton intrauterine pregnancy is visualized  with a CRL consistent with [redacted] weeks gestation, giving an (U/S) EDD of 08/16/17. The (U/S) EDD is NOT consistent with the clinically established (LMP) EDD of 07/11/17. This is unchanged from previous US.  FHR: no heart beat seen CRL measurement: 3.3 mm Yolk sac and and early anatomy is normal.  Right Ovary measures 2.5 x 1.1 x 1.7 cm. It is normal in appearance. Left Ovary measures 4.2 x 3.1 x 2.5 cm. Left ovarian cyst measures 2.6 x 1.9 x 2.5 cm. There is evidence of a corpus luteal cyst in the Left Survey of the adnexa demonstrates left adnexal varices. There is no free peritoneal fluid in the cul de sac.  Impression: 1. 6 week NON Viable Singleton Intrauterine pregnancy by U/S. 2. (U/S) EDD is NOT consistent with Clinically established (LMP) EDD of 07/11/17. 3. 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  Assessment:   1. Missed abortion   Plan:   1. Reviewed [place] report and treatment options  listed below: SURGERY (D&E)  Procedure over in 1 day  Requires being put to sleep  Bleeding may be light  Possible problems during surgery, including injury to womb(uterus)  Care provider has more control Medicine (CYTOTEC)  The complete procedure may take days to weeks  No Surgery  Bleeding may be heavy at times  There may be drug side effects  Patient has more control Waiting  You may choose to wait, in which case your own body may complete the passing of the abnormal early pregnancy on its own in about 2-4 weeks  Your bleeding may be heavy at times  There is a small possibility that you may need surgery if the bleeding is too much or not all of the pregnancy has passed.  2. Repeat US and Beta HCG x 1 week.   3. After discussing treatment options, Stephanie Marquez would like to move ahead with the Cytotec. Treatment regimen, side effect management and red flag symptoms reviewed. Handout given.  4. Cytotec Rx sent to pharmacy last week.   5. Call with further needs, questions, or concerns.    Stephanie Marquez, CNM

## 2016-12-26 ENCOUNTER — Encounter
Admission: EM | Disposition: A | Discharge status: Home / Self Care | Payer: Self-pay | Source: Home / Self Care | Attending: Emergency Medicine

## 2016-12-26 ENCOUNTER — Emergency Department
Admission: EM | Admit: 2016-12-26 | Discharge: 2016-12-27 | Disposition: A | Discharge status: Home / Self Care | Payer: Self-pay | Attending: Emergency Medicine | Admitting: Emergency Medicine

## 2016-12-26 DIAGNOSIS — O034 Incomplete spontaneous abortion without complication: Secondary | ICD-10-CM | POA: Insufficient documentation

## 2016-12-26 DIAGNOSIS — N189 Chronic kidney disease, unspecified: Secondary | ICD-10-CM | POA: Insufficient documentation

## 2016-12-26 DIAGNOSIS — O039 Complete or unspecified spontaneous abortion without complication: Secondary | ICD-10-CM

## 2016-12-26 DIAGNOSIS — N939 Abnormal uterine and vaginal bleeding, unspecified: Secondary | ICD-10-CM

## 2016-12-26 HISTORY — PX: DILATION AND EVACUATION: SHX1459

## 2016-12-26 LAB — CBC WITH DIFFERENTIAL/PLATELET
BASOS PCT: 0 %
Basophils Absolute: 0 10*3/uL (ref 0–0.1)
Basophils Absolute: 0 10*3/uL (ref 0–0.1)
Basophils Relative: 0 %
EOS ABS: 0.1 10*3/uL (ref 0–0.7)
EOS ABS: 0 10*3/uL (ref 0–0.7)
EOS PCT: 1 %
EOS PCT: 0 %
HCT: 31.6 % — ABNORMAL LOW (ref 35.0–47.0)
HCT: 30.9 % — ABNORMAL LOW (ref 35.0–47.0)
HEMOGLOBIN: 10.7 g/dL — AB (ref 12.0–16.0)
Hemoglobin: 11.3 g/dL — ABNORMAL LOW (ref 12.0–16.0)
LYMPHS ABS: 2.8 10*3/uL (ref 1.0–3.6)
Lymphocytes Relative: 33 %
Lymphocytes Relative: 30 %
Lymphs Abs: 2.8 10*3/uL (ref 1.0–3.6)
MCH: 31.2 pg (ref 26.0–34.0)
MCH: 30.7 pg (ref 26.0–34.0)
MCHC: 35.8 g/dL (ref 32.0–36.0)
MCHC: 34.6 g/dL (ref 32.0–36.0)
MCV: 87.3 fL (ref 80.0–100.0)
MCV: 88.8 fL (ref 80.0–100.0)
MONO ABS: 0.5 10*3/uL (ref 0.2–0.9)
MONOS PCT: 7 %
MONOS PCT: 5 %
Monocytes Absolute: 0.6 10*3/uL (ref 0.2–0.9)
NEUTROS PCT: 65 %
Neutro Abs: 5 10*3/uL (ref 1.4–6.5)
Neutro Abs: 6 10*3/uL (ref 1.4–6.5)
Neutrophils Relative %: 59 %
PLATELETS: 211 10*3/uL (ref 150–440)
PLATELETS: 210 10*3/uL (ref 150–440)
RBC: 3.62 MIL/uL — ABNORMAL LOW (ref 3.80–5.20)
RBC: 3.48 MIL/uL — ABNORMAL LOW (ref 3.80–5.20)
RDW: 12.6 % (ref 11.5–14.5)
RDW: 12.7 % (ref 11.5–14.5)
WBC: 8.5 10*3/uL (ref 3.6–11.0)
WBC: 9.3 10*3/uL (ref 3.6–11.0)

## 2016-12-26 LAB — COMPREHENSIVE METABOLIC PANEL
ALT: 12 U/L — ABNORMAL LOW (ref 14–54)
ANION GAP: 7 (ref 5–15)
AST: 23 U/L (ref 15–41)
Albumin: 4.2 g/dL (ref 3.5–5.0)
Alkaline Phosphatase: 50 U/L (ref 38–126)
BUN: 6 mg/dL (ref 6–20)
CALCIUM: 9.2 mg/dL (ref 8.9–10.3)
CO2: 25 mmol/L (ref 22–32)
Chloride: 106 mmol/L (ref 101–111)
Creatinine, Ser: 0.73 mg/dL (ref 0.44–1.00)
Glucose, Bld: 107 mg/dL — ABNORMAL HIGH (ref 65–99)
Potassium: 3.3 mmol/L — ABNORMAL LOW (ref 3.5–5.1)
SODIUM: 138 mmol/L (ref 135–145)
Total Bilirubin: 0.5 mg/dL (ref 0.3–1.2)
Total Protein: 7.3 g/dL (ref 6.5–8.1)

## 2016-12-26 LAB — TYPE AND SCREEN
ABO/RH(D): O POS
ANTIBODY SCREEN: NEGATIVE

## 2016-12-26 LAB — HCG, QUANTITATIVE, PREGNANCY: HCG, BETA CHAIN, QUANT, S: 8565 m[IU]/mL — AB (ref <5)

## 2016-12-26 SURGERY — DILATION AND EVACUATION, UTERUS
Anesthesia: General

## 2016-12-26 MED ORDER — ACETAMINOPHEN 500 MG PO TABS
ORAL_TABLET | ORAL | Status: AC
  Administered 2016-12-26: 1000 mg via ORAL
  Filled 2016-12-26: qty 2

## 2016-12-26 MED ORDER — OXYCODONE HCL 5 MG PO TABS
ORAL_TABLET | ORAL | Status: AC
  Administered 2016-12-26: 5 mg via ORAL
  Filled 2016-12-26: qty 1

## 2016-12-26 MED ORDER — ACETAMINOPHEN 500 MG PO TABS
1000.0000 mg | ORAL_TABLET | Freq: Once | ORAL | Status: AC
Start: 2016-12-26 — End: 2016-12-26
  Administered 2016-12-26: 1000 mg via ORAL

## 2016-12-26 MED ORDER — KETOROLAC TROMETHAMINE 30 MG/ML IJ SOLN
30.0000 mg | Freq: Once | INTRAMUSCULAR | Status: AC
  Administered 2016-12-26: 30 mg via INTRAVENOUS

## 2016-12-26 MED ORDER — SODIUM CHLORIDE 0.9 % IV BOLUS (SEPSIS)
1000.0000 mL | Freq: Once | INTRAVENOUS | Status: AC
  Administered 2016-12-26: 1000 mL via INTRAVENOUS

## 2016-12-26 MED ORDER — KETOROLAC TROMETHAMINE 30 MG/ML IJ SOLN
INTRAMUSCULAR | Status: AC
  Administered 2016-12-26: 30 mg via INTRAVENOUS
  Filled 2016-12-26: qty 1

## 2016-12-26 MED ORDER — OXYCODONE HCL 5 MG PO TABS
5.0000 mg | ORAL_TABLET | Freq: Once | ORAL | Status: AC
  Administered 2016-12-26: 5 mg via ORAL

## 2016-12-26 MED ORDER — NORETHINDRONE ACETATE 5 MG PO TABS
5.0000 mg | ORAL_TABLET | Freq: Once | ORAL | Status: AC
  Administered 2016-12-26: 5 mg via ORAL
  Filled 2016-12-26: qty 1

## 2016-12-26 MED ORDER — NORETHINDRONE ACETATE 5 MG PO TABS: 5.0000 mg | ORAL_TABLET | Freq: Two times a day (BID) | ORAL | 0 refills | Status: DC

## 2016-12-26 SURGICAL SUPPLY — 24 items
ADAPTER VACURETTE TBG SET 14 (CANNULA) IMPLANT
CATH ROBINSON RED A/P 16FR (CATHETERS) ×3 IMPLANT
DRSG TELFA 3X8 NADH (GAUZE/BANDAGES/DRESSINGS) IMPLANT
FILTER UTR ASPR SPEC (MISCELLANEOUS) ×1 IMPLANT
FLTR UTR ASPR SPEC (MISCELLANEOUS) ×3
GLOVE ORTHO TXT STRL SZ7.5 (GLOVE) ×3 IMPLANT
GOWN STRL REUS W/ TWL LRG LVL3 (GOWN DISPOSABLE) ×3 IMPLANT
GOWN STRL REUS W/TWL LRG LVL3 (GOWN DISPOSABLE) ×6
KIT BERKELEY 1ST TRIMESTER 3/8 (MISCELLANEOUS) ×3 IMPLANT
KIT RM TURNOVER STRD PROC AR (KITS) ×3 IMPLANT
NEEDLE HYPO 25X1 1.5 SAFETY (NEEDLE) IMPLANT
PACK DNC HYST (MISCELLANEOUS) ×3 IMPLANT
PAD OB MATERNITY 4.3X12.25 (PERSONAL CARE ITEMS) ×6 IMPLANT
PAD PREP 24X41 OB/GYN DISP (PERSONAL CARE ITEMS) ×3 IMPLANT
SET BERKELEY SUCTION TUBING (SUCTIONS) ×3 IMPLANT
SLEEVE SCD COMPRESS THIGH MED (MISCELLANEOUS) ×3 IMPLANT
SOL PREP PVP 2OZ (MISCELLANEOUS)
SOLUTION PREP PVP 2OZ (MISCELLANEOUS) IMPLANT
SPONGE XRAY 4X4 16PLY STRL (MISCELLANEOUS) IMPLANT
VACURETTE 10 RIGID CVD (CANNULA) ×3 IMPLANT
VACURETTE 12 RIGID CVD (CANNULA) IMPLANT
VACURETTE 7MM F TIP (CANNULA)
VACURETTE 7MM F TIP STRL (CANNULA) IMPLANT
VACURETTE 8 RIGID CVD (CANNULA) IMPLANT

## 2016-12-26 NOTE — ED Provider Notes (Signed)
[facility] Emergency Department Provider Note  ____________________________________________  Time seen: Approximately 7:48 PM  I have reviewed the triage vital signs and the nursing notes.   HISTORY  Chief Complaint Miscarriage   HPI Stephanie RogerKaty Marquez is a 31 y.o. female no significant past medical history who presents for evaluation of vaginal bleeding. 4 days ago patient was found to have a missed miscarriage at [redacted]weeks gestational age. She was given Cytotec which she took that day. She reports that she had heavy bleeding with passing of clots and the bleeding improved by that day in the evening. For the last 2 days she has had normal menstrual bleeding and today around lunch time she started to have severe bleeding again, passing large clots. She is now feeling lightheaded when she stands up. She called her OB/GYN clinic who recommended she come in for evaluation. Patient denies abdominal pain, chest pain, shortness of breath.  Past Medical History:  Diagnosis Date  . Chronic kidney disease   . Kidney stones     There are no active problems to display for this patient.   Past Surgical History:  Procedure Laterality Date  . DILATION AND EVACUATION N/A 12/26/2016   Procedure: DILATATION AND EVACUATION;  Surgeon: Linzie Collinavid James Evans, MD;  Location: [facility] ORS;  Service: Gynecology;  Laterality: N/A;  . ECTOPIC PREGNANCY SURGERY      Prior to Admission medications   Medication Sig Start Date End Date Taking? Authorizing Provider  Prenatal Vit-Fe Fumarate-FA (PRENATAL MULTIVITAMIN) TABS tablet Take 1 tablet by mouth daily at 12 noon.   Yes Historical Provider, MD  Acetaminophen-Codeine (TYLENOL/CODEINE #3) 300-30 MG tablet Take 1 tablet by mouth every 4 (four) hours as needed for pain. 12/27/16   Linzie Collinavid James Evans, MD  ibuprofen (ADVIL,MOTRIN) 600 MG tablet Take 1 tablet (600 mg total) by mouth every 6 (six) hours as needed for mild pain. 12/27/16   Linzie Collinavid James  Evans, MD    Allergies Bactrim [sulfamethoxazole-trimethoprim]  Family History  Problem Relation Age of Onset  . Ovarian cancer Mother   . Rheum arthritis Father     Social History Social History  Substance Use Topics  . Smoking status: Never Smoker  . Smokeless tobacco: Never Used  . Alcohol use No    Review of Systems  Constitutional: Negative for fever. + Lightheadedness Eyes: Negative for visual changes. ENT: Negative for sore throat. Neck: No neck pain  Cardiovascular: Negative for chest pain. Respiratory: Negative for shortness of breath. Gastrointestinal: Negative for abdominal pain, vomiting or diarrhea. Genitourinary: Negative for dysuria. + vaginal bleeding Musculoskeletal: Negative for back pain. Skin: Negative for rash. Neurological: Negative for headaches, weakness or numbness. Psych: No SI or HI  ____________________________________________   PHYSICAL EXAM:  VITAL SIGNS: ED Triage Vitals  Enc Vitals Group     BP 12/26/16 1816 133/85     Pulse Rate 12/26/16 1816 (!) 102     Resp 12/26/16 1816 16     Temp 12/26/16 1816 98.5 F (36.9 C)     Temp Source 12/26/16 1816 Oral     SpO2 12/26/16 1816 100 %     Weight 12/26/16 1816 145 lb (65.8 kg)     Height 12/26/16 1816 5\' 1"  (1.549 m)     Head Circumference --      Peak Flow --      Pain Score 12/26/16 1817 5     Pain Loc --      Pain Edu? --  Excl. in GC? --     Constitutional: Alert and oriented. Well appearing and in no apparent distress. HEENT:      Head: Normocephalic and atraumatic.         Eyes: Conjunctivae are normal. Sclera is non-icteric. EOMI. PERRL      Mouth/Throat: Mucous membranes are moist.       Neck: Supple with no signs of meningismus. Cardiovascular: Tachycardic with regular rhythm. No murmurs, gallops, or rubs. 2+ symmetrical distal pulses are present in all extremities. No JVD. Respiratory: Normal respiratory effort. Lungs are clear to auscultation bilaterally. No  wheezes, crackles, or rhonchi.  Gastrointestinal: Soft, non tender, and non distended with positive bowel sounds. No rebound or guarding. Genitourinary: No CVA tenderness. Pelvic exam: Normal external genitalia, no rashes or lesions. Blood clots and moderate amount of blood in the os, blood clot in the os which was evacuated. No cervical motion tenderness.  No uterine or adnexal tenderness.   Musculoskeletal: Nontender with normal range of motion in all extremities. No edema, cyanosis, or erythema of extremities. Neurologic: Normal speech and language. Face is symmetric. Moving all extremities. No gross focal neurologic deficits are appreciated. Skin: Skin is warm, dry and intact. No rash noted. Psychiatric: Mood and affect are normal. Speech and behavior are normal.  ____________________________________________   LABS (all labs ordered are listed, but only abnormal results are displayed)  Labs Reviewed  CBC WITH DIFFERENTIAL/PLATELET - Abnormal; Notable for the following:       Result Value   RBC 3.62 (*)    Hemoglobin 11.3 (*)    HCT 31.6 (*)    All other components within normal limits  COMPREHENSIVE METABOLIC PANEL - Abnormal; Notable for the following:    Potassium 3.3 (*)    Glucose, Bld 107 (*)    ALT 12 (*)    All other components within normal limits  HCG, QUANTITATIVE, PREGNANCY - Abnormal; Notable for the following:    hCG, Beta Chain, Quant, S 8,565 (*)    All other components within normal limits  CBC WITH DIFFERENTIAL/PLATELET - Abnormal; Notable for the following:    RBC 3.48 (*)    Hemoglobin 10.7 (*)    HCT 30.9 (*)    All other components within normal limits  TYPE AND SCREEN  SURGICAL PATHOLOGY   ____________________________________________  EKG  none  ____________________________________________  RADIOLOGY  none ____________________________________________   PROCEDURES  Procedure(s) performed: None Procedures Critical Care performed:   None ____________________________________________   INITIAL IMPRESSION / ASSESSMENT AND PLAN / ED COURSE  31 y.o. female no significant past medical history who presents for evaluation of vaginal bleeding after taking cytotec 4 days ago for a missed miscarriage. Patient is tachycardic with heart rate of 102, normotensive, she does feel lightheaded when she stands up. Hgb 11.3 ( last value 2 years ago was 14). Will give IVF and discuss with patient's obgyn  Clinical Course as of Dec 28 1753  Sun Dec 26, 2016  2100 Spoke with Dr. Logan Bores, OBGYN on call for [facility] who recommended given patient aygestin PO and discharging her home on aygestin BID with close f/u in the am for [facility].   [CV]  2210 Pelvic exam showing moderate amount of blood and clots in the vault, also a clot was seen in the os. All clots were evacuated. Patient reports decreased bleeding. VS improved after IVF. Will continue to monitor. Repeat hgb pending.  [CV]  2306 Patient continues to feel dizzy, continues to have brisk bleeding.  Even though her hemoglobin has remained stable her blood pressure to low to bit softer at 102/66. I spoke again with Dr. Logan Bores will admit patient and take her to the operating room this evening for a D&E.  [CV]    Clinical Course User Index [CV] Nita Sickle, MD    Pertinent labs & imaging results that were available during my care of the patient were reviewed by me and considered in my medical decision making (see chart for details).    ____________________________________________   FINAL CLINICAL IMPRESSION(S) / ED DIAGNOSES  Final diagnoses:  Miscarriage  Vaginal bleeding      NEW MEDICATIONS STARTED DURING THIS VISIT:  Discharge Medication List as of 12/27/2016  1:41 AM    START taking these medications   Details  Acetaminophen-Codeine (TYLENOL/CODEINE #3) 300-30 MG tablet Take 1 tablet by mouth every 4 (four) hours as needed for pain., Starting Mon 12/27/2016, Print     ibuprofen (ADVIL,MOTRIN) 600 MG tablet Take 1 tablet (600 mg total) by mouth every 6 (six) hours as needed for mild pain., Starting Mon 12/27/2016, Normal         Note:  This document was prepared using Dragon voice recognition software and may include unintentional dictation errors.    Nita Sickle, MD 12/28/16 1755

## 2016-12-26 NOTE — ED Notes (Signed)
Dr. Logan BoresEvans with pt in rm.

## 2016-12-26 NOTE — H&P (Signed)
PRE-OPERATIVE HISTORY AND PHYSICAL EXAM  PCP:  Pcp Not In System  HPI:  Stephanie Marquez is a 31 y.o. [id number].  Patient's last menstrual period was 10/04/2016 (exact date).  She presents today for a pre-op discussion and PE.  She has the following symptoms:  Pt seen in ED with continued vaginal bleeding and dropping H&H.  Know through [facility] to have a non-viable early pregnancy.  Given cytotec and pt passed some clots and tissue, but has begun bleeding and passing clots again.  Became lightheaded several times including in the ED.  ROS: Constitutional: Denied constitutional symptoms, night sweats, recent illness, fatigue, fever, insomnia and weight loss.  Eyes: Denied eye symptoms, eye pain, photophobia, vision change and visual disturbance.  Ears/Nose/Throat/Neck: Denied ear, nose, throat or neck symptoms, hearing loss, nasal discharge, sinus congestion and sore throat.  Cardiovascular: Denied cardiovascular symptoms, arrhythmia, chest pain/pressure, edema, exercise intolerance, orthopnea and palpitations.  Respiratory: Denied pulmonary symptoms, asthma, pleuritic pain, productive sputum, cough, dyspnea and wheezing.  Gastrointestinal: Denied, gastro-esophageal reflux, melena, nausea and vomiting.  Genitourinary: See HPI for additional information.  Musculoskeletal: Denied musculoskeletal symptoms, stiffness, swelling, muscle weakness and myalgia.  Dermatologic: Denied dermatology symptoms, rash and scar.  Neurologic: Denied neurology symptoms, dizziness, headache, neck pain and syncope.  Psychiatric: Denied psychiatric symptoms, anxiety and depression.  Endocrine: Denied endocrine symptoms including hot flashes and night sweats.    OB History  Gravida Para Term Preterm AB Living  6 2 2   1 2   SAB TAB Ectopic Multiple Live Births  0   1   2    # Outcome Date GA Lbr Len/2nd Weight Sex Delivery Anes PTL Lv  6 Current           5 Term 11/30/12    Genella MechM Vag-Spont EPI  LIV  4 Ectopic 2013           3 Term 08/13/10    M Vag-Spont EPI  LIV  2 Gravida           1 Gravida               Past Medical History:  Diagnosis Date  . Chronic kidney disease   . Kidney stones     Past Surgical History:  Procedure Laterality Date  . ECTOPIC PREGNANCY SURGERY        SOCIAL HISTORY: History  Smoking Status  . Never Smoker  Smokeless Tobacco  . Never Used   History  Alcohol Use No   History  Drug Use No    Family History  Problem Relation Age of Onset  . Ovarian cancer Mother   . Rheum arthritis Father     ALLERGIES:  Bactrim [sulfamethoxazole-trimethoprim]  No current facility-administered medications on file prior to encounter.    Current Outpatient Prescriptions on File Prior to Encounter  Medication Sig Dispense Refill  . misoprostol (CYTOTEC) 200 MCG tablet Place four tablets in between your gums and cheeks (two tablets on each side) as instructed OR insert four tablets vaginally 4 tablet 1  . Prenatal Vit-Fe Fumarate-FA (PRENATAL MULTIVITAMIN) TABS tablet Take 1 tablet by mouth daily at 12 noon.     Meds ordered this encounter  Medications  . sodium chloride 0.9 % bolus 1,000 mL  . norethindrone (AYGESTIN) 5 MG tablet    Sig: Take 1 tablet (5 mg total) by mouth 2 (two) times daily.    Dispense:  10 tablet    Refill:  0  . norethindrone (AYGESTIN) tablet 5  mg  . ketorolac (TORADOL) 30 MG/ML injection 30 mg  . ketorolac (TORADOL) 30 MG/ML injection    moffitt, amy: cabinet override  . sodium chloride 0.9 % bolus 1,000 mL  . acetaminophen (TYLENOL) tablet 1,000 mg  . oxyCODONE (Oxy IR/ROXICODONE) immediate release tablet 5 mg  . acetaminophen (TYLENOL) 500 MG tablet    Leonie Man: cabinet override  . oxyCODONE (Oxy IR/ROXICODONE) 5 MG immediate release tablet    Leonie Man: cabinet override     Physical examination BP 105/63 (BP Location: Right Arm)   Pulse 75   Temp 98.5 F (36.9 C) (Oral)   Resp 19   Ht 5\' 1"  (1.549 m)   Wt 145 lb (65.8  kg)   LMP 10/04/2016 (Exact Date)   SpO2 97%   BMI 27.40 kg/m   Physical examination General NAD, Conversant  HEENT Atraumatic; Op clear with mmm.  Normo-cephalic. Pupils reactive. Anicteric sclerae  Thyroid/Neck Smooth without nodularity or enlargement. Normal ROM.  Neck Supple.  Skin No rashes, lesions or ulceration. Normal palpated skin turgor. No nodularity.  Breasts: No masses or discharge.  Symmetric.  No axillary adenopathy.  Lungs: Clear to auscultation.No rales or wheezes. Normal Respiratory effort, no retractions.  Heart: NSR.  No murmurs or rubs appreciated. No periferal edema  Abdomen: Soft.  Non-tender.  No masses.  No HSM. No hernia  Extremities: Moves all appropriately.  Normal ROM for age. No lymphadenopathy.  Neuro: Oriented to PPT.  Normal mood. Normal affect.     Pelvic:   Vulva: Normal appearance.  No lesions.  Vagina: No lesions or abnormalities noted.  Bleeding with clots  Support: Normal pelvic support.  Urethra No masses tenderness or scarring.  Meatus Normal size without lesions or prolapse.  Cervix: Normal ectropion.  No lesions.  Open  Anus: Normal exam.  No lesions.  Perineum: Normal exam.  No lesions.        Bimanual   Uterus: @ 8 wks size..  Non-tender.  Mobile.  AV.  Adnexae: No masses.  Non-tender to palpation.  Cul-de-sac: Negative for abnormality.    Assessment: 1. Miscarriage   2. Vaginal bleeding   3.      Incomplete AB    PLAN: 1.  D&E  Pre-op discussions regarding Risks and Benefits of her scheduled surgery.  D&C/E The procedure and the risks and benefits of dilation and curettage/evacuation have been explained to the patient.  The specific risks of bleeding, infection, anesthesia, uterine perforation, and damage to bowel or bladder  have been specifically discussed.  I have answered all of her questions and I believe that she has an adequate and informed understanding of this procedure.   Elonda Husky, M.D. 12/26/2016 11:53  PM

## 2016-12-26 NOTE — ED Notes (Signed)
FIRST NURSE NOTE: Pt reports she is currently having a miscarriage. Took the pill on Wednesday, bleeding has increased today soaking through a pad almost every hour.

## 2016-12-26 NOTE — ED Triage Notes (Signed)
Pt reports that she was [redacted] weeks pregnant and that the baby stopped growing at 6-7 weeks - she started taking medication to start the miscarriage on Wednesday - today she is having heavy bleeding (soaking 4-5 pads in the last 2 hours) with large clots and was told by L&D to come to the ER for eval

## 2016-12-27 ENCOUNTER — Emergency Department: Payer: Self-pay | Admitting: Anesthesiology

## 2016-12-27 ENCOUNTER — Encounter: Payer: Self-pay | Admitting: Obstetrics and Gynecology

## 2016-12-27 DIAGNOSIS — O021 Missed abortion: Secondary | ICD-10-CM

## 2016-12-27 MED ORDER — OXYCODONE HCL 5 MG/5ML PO SOLN: 5.0000 mg | Freq: Once | ORAL | Status: DC | PRN

## 2016-12-27 MED ORDER — ONDANSETRON HCL 4 MG/2ML IJ SOLN: 4.0000 mg | Freq: Four times a day (QID) | INTRAMUSCULAR | Status: DC | PRN

## 2016-12-27 MED ORDER — DEXTROSE IN LACTATED RINGERS 5 % IV SOLN: INTRAVENOUS | Status: DC

## 2016-12-27 MED ORDER — FENTANYL CITRATE (PF) 100 MCG/2ML IJ SOLN
25.0000 ug | INTRAMUSCULAR | Status: DC | PRN
Start: 2016-12-27 — End: 2016-12-27
  Administered 2016-12-27: 25 ug via INTRAVENOUS
  Administered 2016-12-27: 50 ug via INTRAVENOUS
  Administered 2016-12-27: 25 ug via INTRAVENOUS

## 2016-12-27 MED ORDER — ONDANSETRON 4 MG PO TBDP: 4.0000 mg | ORAL_TABLET | Freq: Four times a day (QID) | ORAL | Status: DC | PRN

## 2016-12-27 MED ORDER — IBUPROFEN 600 MG PO TABS: 600.0000 mg | ORAL_TABLET | Freq: Four times a day (QID) | ORAL | Status: DC | PRN

## 2016-12-27 MED ORDER — FENTANYL CITRATE (PF) 100 MCG/2ML IJ SOLN
INTRAMUSCULAR | Status: DC | PRN
  Administered 2016-12-27 (×2): 25 ug via INTRAVENOUS
  Administered 2016-12-27: 50 ug via INTRAVENOUS

## 2016-12-27 MED ORDER — OXYCODONE-ACETAMINOPHEN 5-325 MG PO TABS: 1.0000 | ORAL_TABLET | ORAL | Status: DC | PRN

## 2016-12-27 MED ORDER — ACETAMINOPHEN-CODEINE 300-30 MG PO TABS: 1.0000 | ORAL_TABLET | ORAL | 0 refills | Status: DC | PRN

## 2016-12-27 MED ORDER — KETOROLAC TROMETHAMINE 30 MG/ML IJ SOLN: 30.0000 mg | Freq: Once | INTRAMUSCULAR | Status: DC

## 2016-12-27 MED ORDER — OXYCODONE HCL 5 MG PO TABS: 5.0000 mg | ORAL_TABLET | Freq: Once | ORAL | Status: DC | PRN

## 2016-12-27 MED ORDER — OXYTOCIN 10 UNIT/ML IJ SOLN
INTRAMUSCULAR | Status: DC | PRN
  Administered 2016-12-27: 30 [IU] via INTRAVENOUS

## 2016-12-27 MED ORDER — LIDOCAINE HCL (CARDIAC) 20 MG/ML IV SOLN
INTRAVENOUS | Status: DC | PRN
  Administered 2016-12-27: 50 mg via INTRAVENOUS

## 2016-12-27 MED ORDER — IBUPROFEN 600 MG PO TABS: 600.0000 mg | ORAL_TABLET | Freq: Four times a day (QID) | ORAL | 0 refills | Status: DC | PRN

## 2016-12-27 MED ORDER — FENTANYL CITRATE (PF) 100 MCG/2ML IJ SOLN
INTRAMUSCULAR | Status: AC
  Administered 2016-12-27: 25 ug via INTRAVENOUS
  Filled 2016-12-27: qty 2

## 2016-12-27 MED ORDER — KETOROLAC TROMETHAMINE 30 MG/ML IJ SOLN
INTRAMUSCULAR | Status: AC
  Filled 2016-12-27: qty 1

## 2016-12-27 MED ORDER — FENTANYL CITRATE (PF) 100 MCG/2ML IJ SOLN
INTRAMUSCULAR | Status: AC
  Filled 2016-12-27: qty 2

## 2016-12-27 MED ORDER — LACTATED RINGERS IV SOLN
INTRAVENOUS | Status: DC | PRN
  Administered 2016-12-27: via INTRAVENOUS

## 2016-12-27 MED ORDER — PROPOFOL 10 MG/ML IV BOLUS
INTRAVENOUS | Status: DC | PRN
  Administered 2016-12-27: 160 mg via INTRAVENOUS

## 2016-12-27 MED ORDER — ACETAMINOPHEN-CODEINE #3 300-30 MG PO TABS
ORAL_TABLET | ORAL | Status: AC
  Administered 2016-12-27: 1 via ORAL
  Filled 2016-12-27: qty 1

## 2016-12-27 MED ORDER — KETOROLAC TROMETHAMINE 30 MG/ML IJ SOLN
30.0000 mg | Freq: Once | INTRAMUSCULAR | Status: AC
  Administered 2016-12-27: 30 mg via INTRAVENOUS

## 2016-12-27 MED ORDER — ACETAMINOPHEN-CODEINE #3 300-30 MG PO TABS
1.0000 | ORAL_TABLET | ORAL | Status: DC | PRN
  Administered 2016-12-27: 1 via ORAL

## 2016-12-27 NOTE — Op Note (Signed)
OPERATIVE NOTE 12/27/2016 12:43 AM  PRE-OPERATIVE DIAGNOSIS:  1) miscarriage  2) Incomplete AB  POST-OPERATIVE DIAGNOSIS:  Same  OPERATION:  D&E  SURGEON(S): Surgeon(s) and Role:    * Linzie Collinavid James Oshea Percival, MD - Primary   ANESTHESIA: Choice  ESTIMATED BLOOD LOSS: 15ml  OPERATIVE FINDINGS: Intra-uterine tissue  SPECIMEN:  ID Type Source Tests Collected by Time Destination  1 : uterine contents Tissue [facility] Gyn biopsy SURGICAL PATHOLOGY Linzie Collinavid James Jun Rightmyer, MD 12/27/2016 0040     COMPLICATIONS: None  DRAINS: Foley to gravity  DISPOSITION: Stable to recovery room  DESCRIPTION OF PROCEDURE:      The patient was prepped and draped in the dorsal lithotomy position and placed under general anesthesia. Her cervix was grasped with a multitooth tenaculum. Respecting the position and curvature of her of the cervix it was dilated to accommodate a number 10 suction curette. The suction curette was placed within the endometrial cavity and a pressure greater than 65 mmHg was allowed to build. A systematic curettage was performed in all quadrants until no additional tissue was noted. The uterus became firm and globular. Pitocin was run in the IV. The tenaculum was removed from the cervix and hemostasis was noted. The weighted speculum was removed and the patient went to recovery room in stable condition.   Elonda Huskyavid J. Lousie Calico, M.D. 12/27/2016 12:43 AM

## 2016-12-27 NOTE — Transfer of Care (Signed)
Immediate Anesthesia Transfer of Care Note  Patient: Stephanie Marquez  Procedure(s) Performed: Procedure(s): DILATATION AND EVACUATION (N/A)  Patient Location: PACU  Anesthesia Type:General  Level of Consciousness: awake and alert   Airway & Oxygen Therapy: Patient Spontanous Breathing  Post-op Assessment: Report given to RN  Post vital signs: Reviewed and stable  Last Vitals:  Vitals:   12/26/16 2343 12/26/16 2345  BP: 105/63 105/63  Pulse: 75   Resp: 19   Temp:      Last Pain:  Vitals:   12/26/16 2346  TempSrc:   PainSc: 2          Complications: No apparent anesthesia complications

## 2016-12-27 NOTE — Anesthesia Preprocedure Evaluation (Signed)
Anesthesia Evaluation  Patient identified by MRN, date of birth, ID band Patient awake    Reviewed: Allergy & Precautions, H&P , NPO status , Patient's Chart, lab work & pertinent test results  Airway Mallampati: II  TM Distance: >3 FB Neck ROM: full    Dental  (+) Teeth Intact   Pulmonary neg pulmonary ROS, neg shortness of breath,    Pulmonary exam normal breath sounds clear to auscultation       Cardiovascular Exercise Tolerance: Good (-) angina(-) Past MI and (-) DOE negative cardio ROS Normal cardiovascular exam Rhythm:regular Rate:Normal     Neuro/Psych negative neurological ROS  negative psych ROS   GI/Hepatic negative GI ROS, Neg liver ROS, neg GERD  ,  Endo/Other  negative endocrine ROS  Renal/GU Renal disease     Musculoskeletal   Abdominal   Peds  Hematology negative hematology ROS (+)   Anesthesia Other Findings Past Medical History: No date: Chronic kidney disease No date: Kidney stones  Past Surgical History: No date: ECTOPIC PREGNANCY SURGERY  BMI    Body Mass Index:  27.40 kg/m      Reproductive/Obstetrics                             Anesthesia Physical Anesthesia Plan  ASA: II  Anesthesia Plan: General LMA   Post-op Pain Management:    Induction:   Airway Management Planned:   Additional Equipment:   Intra-op Plan:   Post-operative Plan:   Informed Consent: I have reviewed the patients History and Physical, chart, labs and discussed the procedure including the risks, benefits and alternatives for the proposed anesthesia with the patient or authorized representative who has indicated his/her understanding and acceptance.   Dental Advisory Given  Plan Discussed with: Anesthesiologist, CRNA and Surgeon  Anesthesia Plan Comments:         Anesthesia Quick Evaluation

## 2016-12-27 NOTE — Anesthesia Postprocedure Evaluation (Signed)
Anesthesia Post Note  Patient: Stephanie Marquez  Procedure(s) Performed: Procedure(s) (LRB): DILATATION AND EVACUATION (N/A)  Patient location during evaluation: PACU Anesthesia Type: General Level of consciousness: awake and alert Pain management: pain level controlled Vital Signs Assessment: post-procedure vital signs reviewed and stable Respiratory status: spontaneous breathing, nonlabored ventilation, respiratory function stable and patient connected to nasal cannula oxygen Cardiovascular status: blood pressure returned to baseline and stable Postop Assessment: no signs of nausea or vomiting Anesthetic complications: no     Last Vitals:  Vitals:   12/27/16 0150 12/27/16 0155  BP:  108/69  Pulse: 82 90  Resp: 15 16  Temp:  (!) 36.1 C    Last Pain:  Vitals:   12/27/16 0155  TempSrc:   PainSc: 1                  Cleda MccreedyJoseph K Piscitello

## 2016-12-27 NOTE — Anesthesia Procedure Notes (Signed)
Procedure Name: LMA Insertion Date/Time: 12/27/2016 12:16 AM Performed by: [id number], Jaymin Waln Pre-anesthesia Checklist: Patient identified, Emergency Drugs available, Suction available, Timeout performed and Patient being monitored Patient Re-evaluated:Patient Re-evaluated prior to inductionOxygen Delivery Method: Circle system utilized Preoxygenation: Pre-oxygenation with 100% oxygen Intubation Type: IV induction LMA: LMA inserted LMA Size: 3.0 Number of attempts: 1 Placement Confirmation: positive ETCO2 Tube secured with: Tape

## 2016-12-27 NOTE — Anesthesia Post-op Follow-up Note (Cosign Needed)
Anesthesia QCDR form completed.        

## 2016-12-27 NOTE — Discharge Instructions (Addendum)
AMBULATORY SURGERY  DISCHARGE INSTRUCTIONS   1) The drugs that you were given will stay in your system until tomorrow so for the next 24 hours you should not:  A) Drive an automobile B) Make any legal decisions C) Drink any alcoholic beverage   2) You may resume regular meals tomorrow.  Today it is better to start with liquids and gradually work up to solid foods.  You may eat anything you prefer, but it is better to start with liquids, then soup and crackers, and gradually work up to solid foods.   3) Please notify your doctor immediately if you have any unusual bleeding, trouble breathing, redness and pain at the surgery site, drainage, fever, or pain not relieved by medication.    4) Additional Instructions:        Please contact your physician with any problems or Same Day Surgery at [phone], Monday through Friday 6 am to 4 pm, or [place] at [facility] number at [phone].  Miscarriage A miscarriage is the loss of an unborn baby (fetus) before the 20th week of pregnancy. The cause is often unknown. Follow these instructions at home:  You may need to stay in bed (bed rest), or you may be able to do light activity. Go about activity as told by your doctor.  Have help at home.  Write down how many pads you use each day. Write down how soaked they are.  Do not use tampons. Do not wash out your vagina (douche) or have sex (intercourse) until your doctor approves.  Only take medicine as told by your doctor.  Do not take aspirin.  Keep all doctor visits as told.  If you or your partner have problems with grieving, talk to your doctor. You can also try counseling. Give yourself time to grieve before trying to get pregnant again. Get help right away if:  You have bad cramps or pain in your back or belly (abdomen).  You have a fever.  You pass large clumps of blood (clots) from your vagina that are walnut-sized or larger. Save the clumps for your doctor  to see.  You pass large amounts of tissue from your vagina. Save the tissue for your doctor to see.  You have more bleeding.  You have thick, bad-smelling fluid (discharge) coming from the vagina.  You get lightheaded, weak, or you pass out (faint).  You have chills. This information is not intended to replace advice given to you by your health care provider. Make sure you discuss any questions you have with your health care provider. Document Released: 02/07/2012 Document Revised: 04/22/2016 Document Reviewed: 12/16/2011 Elsevier Interactive Patient Education  2017 [employer].  Miscarriage A miscarriage is the sudden loss of an unborn baby (fetus) before the 20th week of pregnancy. Most miscarriages happen in the first 3 months of pregnancy. Sometimes, it happens before a woman even knows she is pregnant. A miscarriage is also called a "spontaneous miscarriage" or "early pregnancy loss." Having a miscarriage can be an emotional experience. Talk with your caregiver about any questions you may have about miscarrying, the grieving process, and your future pregnancy plans. What are the causes?  Problems with the fetal chromosomes that make it impossible for the baby to develop normally. Problems with the baby's genes or chromosomes are most often the result of errors that occur, by chance, as the embryo divides and grows. The problems are not inherited from the parents.  Infection of the cervix or uterus.  Hormone  problems.  Problems with the cervix, such as having an incompetent cervix. This is when the tissue in the cervix is not strong enough to hold the pregnancy.  Problems with the uterus, such as an abnormally shaped uterus, uterine fibroids, or congenital abnormalities.  Certain medical conditions.  Smoking, drinking alcohol, or taking illegal drugs.  Trauma. Often, the cause of a miscarriage is unknown. What are the signs or symptoms?  Vaginal bleeding or spotting, with  or without cramps or pain.  Pain or cramping in the abdomen or lower back.  Passing fluid, tissue, or blood clots from the vagina. How is this diagnosed? Your caregiver will perform a physical exam. You may also have an ultrasound to confirm the miscarriage. Blood or urine tests may also be ordered. How is this treated?  Sometimes, treatment is not necessary if you naturally pass all the fetal tissue that was in the uterus. If some of the fetus or placenta remains in the body (incomplete miscarriage), tissue left behind may become infected and must be removed. Usually, a dilation and curettage (D and C) procedure is performed. During a D and C procedure, the cervix is widened (dilated) and any remaining fetal or placental tissue is gently removed from the uterus.  Antibiotic medicines are prescribed if there is an infection. Other medicines may be given to reduce the size of the uterus (contract) if there is a lot of bleeding.  If you have Rh negative blood and your baby was Rh positive, you will need a Rh immunoglobulin shot. This shot will protect any future baby from having Rh blood problems in future pregnancies. Follow these instructions at home:  Your caregiver may order bed rest or may allow you to continue light activity. Resume activity as directed by your caregiver.  Have someone help with home and family responsibilities during this time.  Keep track of the number of sanitary pads you use each day and how soaked (saturated) they are. Write down this information.  Do not use tampons. Do not douche or have sexual intercourse until approved by your caregiver.  Only take over-the-counter or prescription medicines for pain or discomfort as directed by your caregiver.  Do not take aspirin. Aspirin can cause bleeding.  Keep all follow-up appointments with your caregiver.  If you or your partner have problems with grieving, talk to your caregiver or seek counseling to help cope with  the pregnancy loss. Allow enough time to grieve before trying to get pregnant again. Get help right away if:  You have severe cramps or pain in your back or abdomen.  You have a fever.  You pass large blood clots (walnut-sized or larger) ortissue from your vagina. Save any tissue for your caregiver to inspect.  Your bleeding increases.  You have a thick, bad-smelling vaginal discharge.  You become lightheaded, weak, or you faint.  You have chills. This information is not intended to replace advice given to you by your health care provider. Make sure you discuss any questions you have with your health care provider. Document Released: 05/11/2001 Document Revised: 04/22/2016 Document Reviewed: 01/04/2012 Elsevier Interactive Patient Education  2017 [employer] Return to the ER if you feel like you are going to pass out, chest pain or shortness of breath. Otherwise follow up with Dr. Logan BoresEvans in the morning at [facility]. Take the medication prescribed.

## 2016-12-28 ENCOUNTER — Other Ambulatory Visit: Payer: Medicaid Other

## 2016-12-28 ENCOUNTER — Encounter: Payer: Medicaid Other | Admitting: Certified Nurse Midwife

## 2016-12-28 LAB — SURGICAL PATHOLOGY

## 2017-01-03 ENCOUNTER — Encounter: Payer: Self-pay | Admitting: Obstetrics and Gynecology

## 2017-01-03 ENCOUNTER — Ambulatory Visit (INDEPENDENT_AMBULATORY_CARE_PROVIDER_SITE_OTHER): Payer: Self-pay | Admitting: Obstetrics and Gynecology

## 2017-01-03 VITALS — BP 120/72 | HR 69 | Ht 61.0 in | Wt 144.0 lb

## 2017-01-03 DIAGNOSIS — Z9889 Other specified postprocedural states: Secondary | ICD-10-CM

## 2017-01-03 DIAGNOSIS — O021 Missed abortion: Secondary | ICD-10-CM

## 2017-01-03 NOTE — Progress Notes (Signed)
HPI:      Stephanie Marquez is a 31 y.o. [id number] who LMP was Patient's last menstrual period was 10/04/2016.  Subjective: She presents today she is 1 week for D&E for incomplete AB. She is doing well. She has stopped bleeding. She does not desire birth control at this time. She had her husband are considering pregnancy in the near future.    Hx: The following portions of the patient's history were reviewed and updated as appropriate:           She  has a past medical history of Chronic kidney disease and Kidney stones. She  does not have any pertinent problems on file. She  has a past surgical history that includes Ectopic pregnancy surgery and Dilation and evacuation (N/A, 12/26/2016).        ROS: Constitutional: Denied constitutional symptoms, night sweats, recent illness, fatigue, fever, insomnia and weight loss.  Eyes: Denied eye symptoms, eye pain, photophobia, vision change and visual disturbance.  Ears/Nose/Throat/Neck: Denied ear, nose, throat or neck symptoms, hearing loss, nasal discharge, sinus congestion and sore throat.  Cardiovascular: Denied cardiovascular symptoms, arrhythmia, chest pain/pressure, edema, exercise intolerance, orthopnea and palpitations.  Respiratory: Denied pulmonary symptoms, asthma, pleuritic pain, productive sputum, cough, dyspnea and wheezing.  Gastrointestinal: Denied, gastro-esophageal reflux, melena, nausea and vomiting.  Genitourinary: Denied genitourinary symptoms including symptomatic vaginal discharge, pelvic relaxation issues, and urinary complaints.  Musculoskeletal: Denied musculoskeletal symptoms, stiffness, swelling, muscle weakness and myalgia.  Dermatologic: Denied dermatology symptoms, rash and scar.  Neurologic: Denied neurology symptoms, dizziness, headache, neck pain and syncope.  Psychiatric: Denied psychiatric symptoms, anxiety and depression.  Endocrine: Denied endocrine symptoms including hot flashes and night sweats.   Meds: She has a  current medication list which includes the following prescription(s): ibuprofen and prenatal multivitamin.  Objective: Vitals:   01/03/17 0834  BP: 120/72  Pulse: 69  Pathology reviewed with the patient.           Assessment: 1. Missed abortion   2. Post-operative state     Patient doing well at this time. Plan:            1.  Continuation of prenatal vitamins especially during attempting pregnancy. We have discussed timing of future pregnancy. I have asked her to tell us as soon as she becomes pregnant we will consider early ultrasound with next pregnancy. We have discussed the possibility of birth control and she declined at this time.  Orders No orders of the defined types were placed in this encounter.   No orders of the defined types were placed in this encounter.       F/U  Return in about 3 months (around 04/02/2017).  Elonda Huskyavid J. Evans, M.D. 01/03/2017 9:34 AM

## 2017-01-03 NOTE — Progress Notes (Signed)
Pt is here for a 1 week post op check after D&E.Little to no bleeding.

## 2017-11-29 NOTE — L&D Delivery Note (Signed)
     Delivery Note   Nicloe Frontera is a 32 y.o. [id number] at [redacted]w[redacted]d Estimated Date of Delivery: 09/25/18  PRE-OPERATIVE DIAGNOSIS:  1) [redacted]w[redacted]d pregnancy.    POST-OPERATIVE DIAGNOSIS:  1) [redacted]w[redacted]d pregnancy s/p Vaginal, Spontaneous    Delivery Type: Vaginal, Spontaneous    Delivery Anesthesia: Epidural  Labor Complications: None    ESTIMATED BLOOD LOSS: 165  ml    FINDINGS:   1) female infant, Apgar scores of 8    at 1 minute and 9    at 5 minutes and a birthweight pending. Infant skin to skin 2) Nuchal cord: No  SPECIMENS:   PLACENTA:   Appearance: Intact , 3 vessel cord, cord blood sample collected   Removal: Spontaneous      Disposition:   held per protocol then discarded   DISPOSITION:  Infant to left in stable condition in the delivery room, with L&D personnel and mother,  NARRATIVE SUMMARY: Labor course:  Ms. Monnica Saltsman is a [id number] at [redacted]w[redacted]d who presented for SROM and induction of labor.  She progressed well in labor with pitocin.  She received the appropriate anesthesia and proceeded to complete dilation. She evidenced good maternal expulsive effort during the second stage. She went on to deliver a viable female infant. The head delivered OA and rotated to LOT for delivery of the shoulders.The placenta delivered without problems and was noted to be complete. A perineal and vaginal examination was performed. A  2nd degree  laceration was repaired with 3-0 Vicryl Rapide suture. The patient tolerated this well. Vaginal vault count correct x 2. Infant skin to skin with mother, Father of baby at bedside bonding. Pt requesting postpartum tubal.Consent sign in office 07/11/2018. Dr. Logan Bores notified. Patient left in stable condition.   Doreene Burke, CNM  09/21/2018 9:06 AM

## 2018-01-30 ENCOUNTER — Other Ambulatory Visit (INDEPENDENT_AMBULATORY_CARE_PROVIDER_SITE_OTHER): Payer: Self-pay

## 2018-01-30 ENCOUNTER — Ambulatory Visit (INDEPENDENT_AMBULATORY_CARE_PROVIDER_SITE_OTHER): Payer: Self-pay | Admitting: Certified Nurse Midwife

## 2018-01-30 ENCOUNTER — Other Ambulatory Visit: Payer: Self-pay | Admitting: Certified Nurse Midwife

## 2018-01-30 ENCOUNTER — Encounter: Payer: Self-pay | Admitting: Certified Nurse Midwife

## 2018-01-30 VITALS — BP 98/75 | HR 86 | Ht 61.0 in | Wt 149.7 lb

## 2018-01-30 DIAGNOSIS — N926 Irregular menstruation, unspecified: Secondary | ICD-10-CM

## 2018-01-30 DIAGNOSIS — O0910 Supervision of pregnancy with history of ectopic or molar pregnancy, unspecified trimester: Secondary | ICD-10-CM

## 2018-01-30 DIAGNOSIS — Z3200 Encounter for pregnancy test, result unknown: Secondary | ICD-10-CM

## 2018-01-30 DIAGNOSIS — O09299 Supervision of pregnancy with other poor reproductive or obstetric history, unspecified trimester: Secondary | ICD-10-CM

## 2018-01-30 NOTE — Patient Instructions (Signed)
Eating Plan for Pregnant Women While you are pregnant, your body will require additional nutrition to help support your growing baby. It is recommended that you consume:  150 additional calories each day during your first trimester.  300 additional calories each day during your second trimester.  300 additional calories each day during your third trimester.  Eating a healthy, well-balanced diet is very important for your health and for your baby's health. You also have a higher need for some vitamins and minerals, such as folic acid, calcium, iron, and vitamin D. What do I need to know about eating during pregnancy?  Do not try to lose weight or go on a diet during pregnancy.  Choose healthy, nutritious foods. Choose  of a sandwich with a glass of milk instead of a candy bar or a high-calorie sugar-sweetened beverage.  Limit your overall intake of foods that have "empty calories." These are foods that have little nutritional value, such as sweets, desserts, candies, sugar-sweetened beverages, and fried foods.  Eat a variety of foods, especially fruits and vegetables.  Take a prenatal vitamin to help meet the additional needs during pregnancy, specifically for folic acid, iron, calcium, and vitamin D.  Remember to stay active. Ask your health care provider for exercise recommendations that are specific to you.  Practice good food safety and cleanliness, such as washing your hands before you eat and after you prepare raw meat. This helps to prevent foodborne illnesses, such as listeriosis, that can be very dangerous for your baby. Ask your health care provider for more information about listeriosis. What does 150 extra calories look like? Healthy options for an additional 150 calories each day could be any of the following:  Plain low-fat yogurt (6-8 oz) with  cup of berries.  1 apple with 2 teaspoons of peanut butter.  Cut-up vegetables with  cup of hummus.  Low-fat chocolate milk  (8 oz or 1 cup).  1 string cheese with 1 medium orange.   of a peanut butter and jelly sandwich on whole-wheat bread (1 tsp of peanut butter).  For 300 calories, you could eat two of those healthy options each day. What is a healthy amount of weight to gain? The recommended amount of weight for you to gain is based on your pre-pregnancy BMI. If your pre-pregnancy BMI was:  Less than 18 (underweight), you should gain 28-40 lb.  18-24.9 (normal), you should gain 25-35 lb.  25-29.9 (overweight), you should gain 15-25 lb.  Greater than 30 (obese), you should gain 11-20 lb.  What if I am having twins or multiples? Generally, pregnant women who will be having twins or multiples may need to increase their daily calories by 300-600 calories each day. The recommended range for total weight gain is 25-54 lb, depending on your pre-pregnancy BMI. Talk with your health care provider for specific guidance about additional nutritional needs, weight gain, and exercise during your pregnancy. What foods can I eat? Grains Any grains. Try to choose whole grains, such as whole-wheat bread, oatmeal, or [place] rice. Vegetables Any vegetables. Try to eat a variety of colors and types of vegetables to get a full range of vitamins and minerals. Remember to wash your vegetables well before eating. Fruits Any fruits. Try to eat a variety of colors and types of fruit to get a full range of vitamins and minerals. Remember to wash your fruits well before eating. Meats and Other Protein Sources Lean meats, including chicken, [place], fish, and lean cuts of beef, veal,  or pork. Make sure that all meats are cooked to "well done." Tofu. Tempeh. Beans. Eggs. Peanut butter and other nut butters. Seafood, such as shrimp, crab, and lobster. If you choose fish, select types that are higher in omega-3 fatty acids, including salmon, herring, mussels, trout, sardines, and pollock. Make sure that all meats are cooked to food-safe  temperatures. Dairy Pasteurized milk and milk alternatives. Pasteurized yogurt and pasteurized cheese. Cottage cheese. Sour cream. Beverages Water. Juices that contain 100% fruit juice or vegetable juice. Caffeine-free teas and decaffeinated coffee. Drinks that contain caffeine are okay to drink, but it is better to avoid caffeine. Keep your total caffeine intake to less than 200 mg each day (12 oz of coffee, tea, or soda) or as directed by your health care provider. Condiments Any pasteurized condiments. Sweets and Desserts Any sweets and desserts. Fats and Oils Any fats and oils. The items listed above may not be a complete list of recommended foods or beverages. Contact your dietitian for more options. What foods are not recommended? Vegetables Unpasteurized (raw) vegetable juices. Fruits Unpasteurized (raw) fruit juices. Meats and Other Protein Sources Cured meats that have nitrates, such as bacon, salami, and hotdogs. Luncheon meats, bologna, or other deli meats (unless they are reheated until they are steaming hot). Refrigerated pate, meat spreads from a meat counter, smoked seafood that is found in the refrigerated section of a store. Raw fish, such as sushi or sashimi. High mercury content fish, such as tilefish, shark, swordfish, and king mackerel. Raw meats, such as tuna or beef tartare. Undercooked meats and poultry. Make sure that all meats are cooked to food-safe temperatures. Dairy Unpasteurized (raw) milk and any foods that have raw milk in them. Soft cheeses, such as feta, queso blanco, queso fresco, Brie, Camembert cheeses, blue-veined cheeses, and Panela cheese (unless it is made with pasteurized milk, which must be stated on the label). Beverages Alcohol. Sugar-sweetened beverages, such as sodas, teas, or energy drinks. Condiments Homemade fermented foods and drinks, such as pickles, sauerkraut, or kombucha drinks. (Store-bought pasteurized versions of these are  okay.) Other Salads that are made in the store, such as ham salad, chicken salad, egg salad, tuna salad, and seafood salad. The items listed above may not be a complete list of foods and beverages to avoid. Contact your dietitian for more information. This information is not intended to replace advice given to you by your health care provider. Make sure you discuss any questions you have with your health care provider. Document Released: 08/30/2014 Document Revised: 04/22/2016 Document Reviewed: 04/30/2014 Elsevier Interactive Patient Education  2018 [employer]. Common Medications Safe in Pregnancy  Acne:      Constipation:  Benzoyl Peroxide     Colace  Clindamycin      Dulcolax Suppository  Topica Erythromycin     Fibercon  Salicylic Acid      Metamucil         Miralax AVOID:        Senakot   Accutane    Cough:  Retin-A       Cough Drops  Tetracycline      Phenergan w/ Codeine if Rx  Minocycline      Robitussin (Plain & DM)  Antibiotics:     Crabs/Lice:  Ceclor       RID  Cephalosporins    AVOID:  E-Mycins      Kwell  Keflex  Macrobid/Macrodantin   Diarrhea:  Penicillin      Kao-Pectate  Zithromax  Imodium AD         PUSH FLUIDS AVOID:       Cipro     Fever:  Tetracycline      Tylenol (Regular or Extra  Minocycline       Strength)  Levaquin      Extra Strength-Do not          Exceed 8 tabs/24 hrs Caffeine:        <262m/day (equiv. To 1 cup of coffee or  approx. 3 12 oz sodas)         Gas: Cold/Hayfever:       Gas-X  Benadryl      Mylicon  Claritin       Phazyme  **Claritin-D        Chlor-Trimeton    Headaches:  Dimetapp      ASA-Free Excedrin  Drixoral-Non-Drowsy     Cold Compress  Mucinex (Guaifenasin)     Tylenol (Regular or Extra  Sudafed/Sudafed-12 Hour     Strength)  **Sudafed PE Pseudoephedrine   Tylenol Cold & Sinus     Vicks Vapor Rub  Zyrtec  **AVOID if Problems With Blood Pressure         Heartburn: Avoid lying down for at least 1 hour  after meals  Aciphex      Maalox     Rash:  Milk of Magnesia     Benadryl    Mylanta       1% Hydrocortisone Cream  Pepcid  Pepcid Complete   Sleep Aids:  Prevacid      Ambien   Prilosec       Benadryl  Rolaids       Chamomile Tea  Tums (Limit 4/day)     Unisom  Zantac       Tylenol PM         Warm milk-add vanilla or  Hemorrhoids:       Sugar for taste  Anusol/Anusol H.C.  (RX: Analapram 2.5%)  Sugar Substitutes:  Hydrocortisone OTC     Ok in moderation  Preparation H      Tucks        Vaseline lotion applied to tissue with wiping    Herpes:     Throat:  Acyclovir      Oragel  Famvir  Valtrex     Vaccines:         Flu Shot Leg Cramps:       *Gardasil  Benadryl      Hepatitis A         Hepatitis B Nasal Spray:       Pneumovax  Saline Nasal Spray     Polio Booster         Tetanus Nausea:       Tuberculosis test or PPD  Vitamin B6 25 mg TID   AVOID:    Dramamine      *Gardasil  Emetrol       Live Poliovirus  Ginger Root 250 mg QID    MMR (measles, mumps &  High Complex Carbs @ Bedtime    rebella)  Sea Bands-Accupressure    Varicella (Chickenpox)  Unisom 1/2 tab TID     *No known complications           If received before Pain:         Known pregnancy;   Darvocet       Resume series after  Lortab        Delivery  Percocet  Yeast:   Tramadol      Femstat  Tylenol 3      Gyne-lotrimin  Ultram       Monistat  Vicodin           MISC:         All Sunscreens           Hair Coloring/highlights          Insect Repellant's          (Including DEET)         Mystic Tans First Trimester of Pregnancy The first trimester of pregnancy is from week 1 until the end of week 13 (months 1 through 3). During this time, your baby will begin to develop inside you. At 6-8 weeks, the eyes and face are formed, and the heartbeat can be seen on ultrasound. At the end of 12 weeks, all the baby's organs are formed. Prenatal care is all the medical care you receive before the birth of your  baby. Make sure you get good prenatal care and follow all of your doctor's instructions. Follow these instructions at home: Medicines  Take over-the-counter and prescription medicines only as told by your doctor. Some medicines are safe and some medicines are not safe during pregnancy.  Take a prenatal vitamin that contains at least 600 micrograms (mcg) of folic acid.  If you have trouble pooping (constipation), take medicine that will make your stool soft (stool softener) if your doctor approves. Eating and drinking  Eat regular, healthy meals.  Your doctor will tell you the amount of weight gain that is right for you.  Avoid raw meat and uncooked cheese.  If you feel sick to your stomach (nauseous) or throw up (vomit): ? Eat 4 or 5 small meals a day instead of 3 large meals. ? Try eating a few soda crackers. ? Drink liquids between meals instead of during meals.  To prevent constipation: ? Eat foods that are high in fiber, like fresh fruits and vegetables, whole grains, and beans. ? Drink enough fluids to keep your pee (urine) clear or pale yellow. Activity  Exercise only as told by your doctor. Stop exercising if you have cramps or pain in your lower belly (abdomen) or low back.  Do not exercise if it is too hot, too humid, or if you are in a place of great height (high altitude).  Try to avoid standing for long periods of time. Move your legs often if you must stand in one place for a long time.  Avoid heavy lifting.  Wear low-heeled shoes. Sit and stand up straight.  You can have sex unless your doctor tells you not to. Relieving pain and discomfort  Wear a good support bra if your breasts are sore.  Take warm water baths (sitz baths) to soothe pain or discomfort caused by hemorrhoids. Use hemorrhoid cream if your doctor says it is okay.  Rest with your legs raised if you have leg cramps or low back pain.  If you have puffy, bulging veins (varicose veins) in your  legs: ? Wear support hose or compression stockings as told by your doctor. ? Raise (elevate) your feet for 15 minutes, 3-4 times a day. ? Limit salt in your food. Prenatal care  Schedule your prenatal visits by the twelfth week of pregnancy.  Write down your questions. Take them to your prenatal visits.  Keep all your prenatal visits as told by your doctor. This is important. Safety  Wear your  seat belt at all times when driving.  Make a list of emergency phone numbers. The list should include numbers for family, friends, the hospital, and police and fire departments. General instructions  Ask your doctor for a referral to a local prenatal class. Begin classes no later than at the start of month 6 of your pregnancy.  Ask for help if you need counseling or if you need help with nutrition. Your doctor can give you advice or tell you where to go for help.  Do not use hot tubs, steam rooms, or saunas.  Do not douche or use tampons or scented sanitary pads.  Do not cross your legs for long periods of time.  Avoid all herbs and alcohol. Avoid drugs that are not approved by your doctor.  Do not use any tobacco products, including cigarettes, chewing tobacco, and electronic cigarettes. If you need help quitting, ask your doctor. You may get counseling or other support to help you quit.  Avoid cat litter boxes and soil used by cats. These carry germs that can cause birth defects in the baby and can cause a loss of your baby (miscarriage) or stillbirth.  Visit your dentist. At home, brush your teeth with a soft toothbrush. Be gentle when you floss. Contact a doctor if:  You are dizzy.  You have mild cramps or pressure in your lower belly.  You have a nagging pain in your belly area.  You continue to feel sick to your stomach, you throw up, or you have watery poop (diarrhea).  You have a bad smelling fluid coming from your vagina.  You have pain when you pee (urinate).  You have  increased puffiness (swelling) in your face, hands, legs, or ankles. Get help right away if:  You have a fever.  You are leaking fluid from your vagina.  You have spotting or bleeding from your vagina.  You have very bad belly cramping or pain.  You gain or lose weight rapidly.  You throw up blood. It may look like coffee grounds.  You are around people who have German measles, fifth disease, or chickenpox.  You have a very bad headache.  You have shortness of breath.  You have any kind of trauma, such as from a fall or a car accident. Summary  The first trimester of pregnancy is from week 1 until the end of week 13 (months 1 through 3).  To take care of yourself and your unborn baby, you will need to eat healthy meals, take medicines only if your doctor tells you to do so, and do activities that are safe for you and your baby.  Keep all follow-up visits as told by your doctor. This is important as your doctor will have to ensure that your baby is healthy and growing well. This information is not intended to replace advice given to you by your health care provider. Make sure you discuss any questions you have with your health care provider. Document Released: 05/03/2008 Document Revised: 11/23/2016 Document Reviewed: 11/23/2016 Elsevier Interactive Patient Education  2017 [employer]  

## 2018-01-30 NOTE — Progress Notes (Signed)
Pt is doing well pregnancy test positive. Wants to talk about preventing/concerns about miscarriages.

## 2018-01-31 ENCOUNTER — Encounter: Payer: Self-pay | Admitting: Certified Nurse Midwife

## 2018-01-31 DIAGNOSIS — O0910 Supervision of pregnancy with history of ectopic or molar pregnancy, unspecified trimester: Secondary | ICD-10-CM | POA: Insufficient documentation

## 2018-01-31 LAB — POCT URINE PREGNANCY: Preg Test, Ur: POSITIVE — AB

## 2018-01-31 NOTE — Progress Notes (Signed)
GYN ENCOUNTER NOTE  Subjective:       Stephanie Marquez is a 32 y.o. [phone] female here for pregnancy confirmation. History significant for multiple miscarriages and ectopic pregnancy.   Reports single positive home pregnancy test. Endorses regular periods after D&E until last month. Pregnancy symptoms include breast tenderness, fatigue, and nausea.   Denies difficulty breathing or respiratory distress, chest pain, abdominal pain, vaginal bleeding, dysuria, and leg pain or swelling.    Gynecologic History  Patient's last menstrual period was 12/19/2017 (lmp unknown).   Estimated date of birth: 09/25/2018.   Gestational age: 24 weeks 0 days  Contraception: none  Last Pap: 04/21/2015. Results were: normal  Obstetric History  OB History  Gravida Para Term Preterm AB Living  7 2 2   4 2   SAB TAB Ectopic Multiple Live Births  3   1   2     # Outcome Date GA Lbr Len/2nd Weight Sex Delivery Anes PTL Lv  7 Current           6 SAB 12/27/16          5 SAB 2016          4 Term 11/30/12    Judie Petit Vag-Spont EPI  LIV  3 SAB 2014          2 Ectopic 2013          1 Term 08/13/10    M Vag-Spont EPI  LIV      Past Medical History:  Diagnosis Date  . Chronic kidney disease   . Kidney stones     Past Surgical History:  Procedure Laterality Date  . DILATION AND EVACUATION N/A 12/26/2016   Procedure: DILATATION AND EVACUATION;  Surgeon: Linzie Collin, MD;  Location: [facility];  Service: Gynecology;  Laterality: N/A;  . ECTOPIC PREGNANCY SURGERY      Current Outpatient Medications on File Prior to Visit  Medication Sig Dispense Refill  . Prenatal Vit-Fe Fumarate-FA (PRENATAL MULTIVITAMIN) TABS tablet Take 1 tablet by mouth daily at 12 noon.     No current facility-administered medications on file prior to visit.     Allergies  Allergen Reactions  . Bactrim [Sulfamethoxazole-Trimethoprim] Hives and Swelling    Social History   Socioeconomic History  . Marital status: Married   Spouse name: Not on file  . Number of children: Not on file  . Years of education: Not on file  . Highest education level: Not on file  Social Needs  . Financial resource strain: Not on file  . Food insecurity - worry: Not on file  . Food insecurity - inability: Not on file  . Transportation needs - medical: Not on file  . Transportation needs - non-medical: Not on file  Occupational History  . Not on file  Tobacco Use  . Smoking status: Never Smoker  . Smokeless tobacco: Never Used  Substance and Sexual Activity  . Alcohol use: No  . Drug use: No  . Sexual activity: Yes    Birth control/protection: None  Other Topics Concern  . Not on file  Social History Narrative  . Not on file    Family History  Problem Relation Age of Onset  . Ovarian cancer Mother   . Rheum arthritis Father     The following portions of the patient's history were reviewed and updated as appropriate: allergies, current medications, past family history, past medical history, past social history, past surgical history and problem list.  Review of Systems  Review of Systems - Negative except as noted above. History obtained from the patient.   Objective:   BP 98/75   Pulse 86   Ht 5\' 1"  (1.549 m)   Wt 149 lb 11.2 oz (67.9 kg)   LMP 12/19/2017 (LMP Unknown)   BMI 28.29 kg/m    Alert and oriented x 4, no apparent distress.   Positive UPT  ULTRASOUND REPORT  Location: [facility] Date of Service:  01/30/2018  Indications: Dating/Viability; Hx of miscarriages/ectopic Findings:  Singleton intrauterine pregnancy is visualized with a CRL consistent with [redacted] weeks gestation, giving an (U/S) EDD of 09/25/18. A clinical EDD has not been established.  FHR: 119 BPM CRL measurement: 3.2 mm Yolk sac and early anatomy is normal.  Right Ovary measures 4.0 x 3.4 x 2.7 cm and contains a probable corpus luteal cyst.  Left Ovary was not visualized. There is evidence of a corpus luteal  cyst in the right ovary. Survey of the adnexa demonstrates no adnexal masses. There is no free peritoneal fluid in the cul de sac.  Impression: 1. 6 week Viable Singleton Intrauterine pregnancy by U/S. 2. A clinical EDD has not been established, so today's ultrasound should be used.  Assessment:   1. Encounter for confirmation of pregnancy test result with physical examination - POCT urine pregnancy  2. Pregnancy with history of ectopic pregnancy, antepartum  Plan:   Ultrasound findings reviewed with patient, verbalized understanding.   Discussed red flag symptoms and when to call.   RTC x 3 week for nurse intake with follow up US.    Gunnar BullaJenkins Michelle Gavriella Hearst, CNM [facility], [facility]

## 2018-02-20 ENCOUNTER — Other Ambulatory Visit (INDEPENDENT_AMBULATORY_CARE_PROVIDER_SITE_OTHER): Payer: Self-pay

## 2018-02-20 ENCOUNTER — Ambulatory Visit (INDEPENDENT_AMBULATORY_CARE_PROVIDER_SITE_OTHER): Payer: Medicaid Other | Admitting: Certified Nurse Midwife

## 2018-02-20 ENCOUNTER — Encounter: Payer: Self-pay | Admitting: Certified Nurse Midwife

## 2018-02-20 VITALS — BP 88/48 | HR 85 | Ht 61.0 in | Wt 148.0 lb

## 2018-02-20 DIAGNOSIS — N926 Irregular menstruation, unspecified: Secondary | ICD-10-CM

## 2018-02-20 NOTE — Patient Instructions (Signed)
First Trimester of Pregnancy The first trimester of pregnancy is from week 1 until the end of week 13 (months 1 through 3). During this time, your baby will begin to develop inside you. At 6-8 weeks, the eyes and face are formed, and the heartbeat can be seen on ultrasound. At the end of 12 weeks, all the baby's organs are formed. Prenatal care is all the medical care you receive before the birth of your baby. Make sure you get good prenatal care and follow all of your doctor's instructions. Follow these instructions at home: Medicines  Take over-the-counter and prescription medicines only as told by your doctor. Some medicines are safe and some medicines are not safe during pregnancy.  Take a prenatal vitamin that contains at least 600 micrograms (mcg) of folic acid.  If you have trouble pooping (constipation), take medicine that will make your stool soft (stool softener) if your doctor approves. Eating and drinking  Eat regular, healthy meals.  Your doctor will tell you the amount of weight gain that is right for you.  Avoid raw meat and uncooked cheese.  If you feel sick to your stomach (nauseous) or throw up (vomit): ? Eat 4 or 5 small meals a day instead of 3 large meals. ? Try eating a few soda crackers. ? Drink liquids between meals instead of during meals.  To prevent constipation: ? Eat foods that are high in fiber, like fresh fruits and vegetables, whole grains, and beans. ? Drink enough fluids to keep your pee (urine) clear or pale yellow. Activity  Exercise only as told by your doctor. Stop exercising if you have cramps or pain in your lower belly (abdomen) or low back.  Do not exercise if it is too hot, too humid, or if you are in a place of great height (high altitude).  Try to avoid standing for long periods of time. Move your legs often if you must stand in one place for a long time.  Avoid heavy lifting.  Wear low-heeled shoes. Sit and stand up straight.  You  can have sex unless your doctor tells you not to. Relieving pain and discomfort  Wear a good support bra if your breasts are sore.  Take warm water baths (sitz baths) to soothe pain or discomfort caused by hemorrhoids. Use hemorrhoid cream if your doctor says it is okay.  Rest with your legs raised if you have leg cramps or low back pain.  If you have puffy, bulging veins (varicose veins) in your legs: ? Wear support hose or compression stockings as told by your doctor. ? Raise (elevate) your feet for 15 minutes, 3-4 times a day. ? Limit salt in your food. Prenatal care  Schedule your prenatal visits by the twelfth week of pregnancy.  Write down your questions. Take them to your prenatal visits.  Keep all your prenatal visits as told by your doctor. This is important. Safety  Wear your seat belt at all times when driving.  Make a list of emergency phone numbers. The list should include numbers for family, friends, the hospital, and police and fire departments. General instructions  Ask your doctor for a referral to a local prenatal class. Begin classes no later than at the start of month 6 of your pregnancy.  Ask for help if you need counseling or if you need help with nutrition. Your doctor can give you advice or tell you where to go for help.  Do not use hot tubs, steam rooms, or   saunas.  Do not douche or use tampons or scented sanitary pads.  Do not cross your legs for long periods of time.  Avoid all herbs and alcohol. Avoid drugs that are not approved by your doctor.  Do not use any tobacco products, including cigarettes, chewing tobacco, and electronic cigarettes. If you need help quitting, ask your doctor. You may get counseling or other support to help you quit.  Avoid cat litter boxes and soil used by cats. These carry germs that can cause birth defects in the baby and can cause a loss of your baby (miscarriage) or stillbirth.  Visit your dentist. At home, brush  your teeth with a soft toothbrush. Be gentle when you floss. Contact a doctor if:  You are dizzy.  You have mild cramps or pressure in your lower belly.  You have a nagging pain in your belly area.  You continue to feel sick to your stomach, you throw up, or you have watery poop (diarrhea).  You have a bad smelling fluid coming from your vagina.  You have pain when you pee (urinate).  You have increased puffiness (swelling) in your face, hands, legs, or ankles. Get help right away if:  You have a fever.  You are leaking fluid from your vagina.  You have spotting or bleeding from your vagina.  You have very bad belly cramping or pain.  You gain or lose weight rapidly.  You throw up blood. It may look like coffee grounds.  You are around people who have German measles, fifth disease, or chickenpox.  You have a very bad headache.  You have shortness of breath.  You have any kind of trauma, such as from a fall or a car accident. Summary  The first trimester of pregnancy is from week 1 until the end of week 13 (months 1 through 3).  To take care of yourself and your unborn baby, you will need to eat healthy meals, take medicines only if your doctor tells you to do so, and do activities that are safe for you and your baby.  Keep all follow-up visits as told by your doctor. This is important as your doctor will have to ensure that your baby is healthy and growing well. This information is not intended to replace advice given to you by your health care provider. Make sure you discuss any questions you have with your health care provider. Document Released: 05/03/2008 Document Revised: 11/23/2016 Document Reviewed: 11/23/2016 Elsevier Interactive Patient Education  2017 [employer] Inc[employer]  

## 2018-02-20 NOTE — Progress Notes (Signed)
Stephanie Marquez presents for NOB nurse interview visit. Pregnancy confirmation done here at [facility].  G- 7.  P- 2   . Pregnancy education material explained and given. _No__ cats in the home. NOB labs ordered. Marland Kitchen. HIV labs and Drug screen were explained optional and she did not decline. Drug screen ordered/ PNV encouraged. Genetic screening options discussed. Genetic testing:Unsure .  Pt may discuss with provider. Pt. To follow up with provider in _3_ weeks for NOB physical.  All questions answered.

## 2018-02-21 LAB — HEPATITIS B SURFACE ANTIGEN: Hepatitis B Surface Ag: NEGATIVE

## 2018-02-21 LAB — URINALYSIS, ROUTINE W REFLEX MICROSCOPIC
BILIRUBIN UA: NEGATIVE
GLUCOSE, UA: NEGATIVE
Ketones, UA: NEGATIVE
Leukocytes, UA: NEGATIVE
NITRITE UA: NEGATIVE
PROTEIN UA: NEGATIVE
RBC, UA: NEGATIVE
Specific Gravity, UA: 1.011 (ref 1.005–1.030)
UUROB: 0.2 mg/dL (ref 0.2–1.0)
pH, UA: 6.5 (ref 5.0–7.5)

## 2018-02-21 LAB — ANTIBODY SCREEN: Antibody Screen: NEGATIVE

## 2018-02-21 LAB — CBC WITH DIFFERENTIAL/PLATELET
BASOS: 0 %
Basophils Absolute: 0 10*3/uL (ref 0.0–0.2)
EOS (ABSOLUTE): 0.1 10*3/uL (ref 0.0–0.4)
EOS: 1 %
HEMATOCRIT: 40.6 % (ref 34.0–46.6)
Hemoglobin: 14 g/dL (ref 11.1–15.9)
IMMATURE GRANS (ABS): 0 10*3/uL (ref 0.0–0.1)
IMMATURE GRANULOCYTES: 0 %
LYMPHS: 28 %
Lymphocytes Absolute: 2.1 10*3/uL (ref 0.7–3.1)
MCH: 30.8 pg (ref 26.6–33.0)
MCHC: 34.5 g/dL (ref 31.5–35.7)
MCV: 89 fL (ref 79–97)
MONOS ABS: 0.6 10*3/uL (ref 0.1–0.9)
Monocytes: 9 %
NEUTROS PCT: 62 %
Neutrophils Absolute: 4.7 10*3/uL (ref 1.4–7.0)
PLATELETS: 210 10*3/uL (ref 150–379)
RBC: 4.55 x10E6/uL (ref 3.77–5.28)
RDW: 13.2 % (ref 12.3–15.4)
WBC: 7.5 10*3/uL (ref 3.4–10.8)

## 2018-02-21 LAB — RPR: RPR: NONREACTIVE

## 2018-02-21 LAB — MONITOR DRUG PROFILE 14(MW)
AMPHETAMINE SCREEN URINE: NEGATIVE ng/mL
BARBITURATE SCREEN URINE: NEGATIVE ng/mL
BENZODIAZEPINE SCREEN, URINE: NEGATIVE ng/mL
Buprenorphine, Urine: NEGATIVE ng/mL
CANNABINOIDS UR QL SCN: NEGATIVE ng/mL
Cocaine (Metab) Scrn, Ur: NEGATIVE ng/mL
Creatinine(Crt), U: 59.7 mg/dL (ref 20.0–300.0)
FENTANYL, URINE: NEGATIVE pg/mL
Meperidine Screen, Urine: NEGATIVE ng/mL
Methadone Screen, Urine: NEGATIVE ng/mL
OXYCODONE+OXYMORPHONE UR QL SCN: NEGATIVE ng/mL
Opiate Scrn, Ur: NEGATIVE ng/mL
PH UR, DRUG SCRN: 5.8 (ref 4.5–8.9)
Phencyclidine Qn, Ur: NEGATIVE ng/mL
Propoxyphene Scrn, Ur: NEGATIVE ng/mL
SPECIFIC GRAVITY: 1.009
Tramadol Screen, Urine: NEGATIVE ng/mL

## 2018-02-21 LAB — ABO AND RH: Rh Factor: POSITIVE

## 2018-02-21 LAB — URINE CULTURE: ORGANISM ID, BACTERIA: NO GROWTH

## 2018-02-21 LAB — HIV ANTIBODY (ROUTINE TESTING W REFLEX): HIV SCREEN 4TH GENERATION: NONREACTIVE

## 2018-02-21 LAB — RUBELLA SCREEN: RUBELLA: 1.41 {index} (ref >0.99)

## 2018-02-21 LAB — VARICELLA ZOSTER ANTIBODY, IGG: Varicella zoster IgG: 2031 index (ref >165)

## 2018-02-21 NOTE — Progress Notes (Signed)
I have reviewed the record and concur with patient management and plan of care.    [name] [name], CNM [facility], [facility] 

## 2018-02-22 LAB — GC/CHLAMYDIA PROBE AMP
Chlamydia trachomatis, NAA: NEGATIVE
NEISSERIA GONORRHOEAE BY PCR: NEGATIVE

## 2018-03-13 ENCOUNTER — Ambulatory Visit (INDEPENDENT_AMBULATORY_CARE_PROVIDER_SITE_OTHER): Payer: Medicaid Other | Admitting: Certified Nurse Midwife

## 2018-03-13 ENCOUNTER — Encounter: Payer: Self-pay | Admitting: Certified Nurse Midwife

## 2018-03-13 VITALS — BP 96/60 | HR 71 | Wt 149.2 lb

## 2018-03-13 DIAGNOSIS — Z3481 Encounter for supervision of other normal pregnancy, first trimester: Secondary | ICD-10-CM

## 2018-03-13 LAB — POCT URINALYSIS DIPSTICK
Bilirubin, UA: NEGATIVE
Blood, UA: NEGATIVE
GLUCOSE UA: NEGATIVE
KETONES UA: NEGATIVE
LEUKOCYTES UA: NEGATIVE
NITRITE UA: NEGATIVE
Protein, UA: NEGATIVE
SPEC GRAV UA: 1.025 (ref 1.010–1.025)
Urobilinogen, UA: 0.2 E.U./dL
pH, UA: 5 (ref 5.0–8.0)

## 2018-03-13 MED ORDER — DOXYLAMINE-PYRIDOXINE ER 20-20 MG PO TBCR: 1.0000 | EXTENDED_RELEASE_TABLET | Freq: Two times a day (BID) | ORAL | 4 refills | Status: DC

## 2018-03-13 NOTE — Progress Notes (Signed)
Pt is here for a NOB physical. LPS 04/17/15 neg.

## 2018-03-13 NOTE — Patient Instructions (Signed)
Eating Plan for Pregnant Women While you are pregnant, your body will require additional nutrition to help support your growing baby. It is recommended that you consume:  150 additional calories each day during your first trimester.  300 additional calories each day during your second trimester.  300 additional calories each day during your third trimester.  Eating a healthy, well-balanced diet is very important for your health and for your baby's health. You also have a higher need for some vitamins and minerals, such as folic acid, calcium, iron, and vitamin D. What do I need to know about eating during pregnancy?  Do not try to lose weight or go on a diet during pregnancy.  Choose healthy, nutritious foods. Choose  of a sandwich with a glass of milk instead of a candy bar or a high-calorie sugar-sweetened beverage.  Limit your overall intake of foods that have "empty calories." These are foods that have little nutritional value, such as sweets, desserts, candies, sugar-sweetened beverages, and fried foods.  Eat a variety of foods, especially fruits and vegetables.  Take a prenatal vitamin to help meet the additional needs during pregnancy, specifically for folic acid, iron, calcium, and vitamin D.  Remember to stay active. Ask your health care provider for exercise recommendations that are specific to you.  Practice good food safety and cleanliness, such as washing your hands before you eat and after you prepare raw meat. This helps to prevent foodborne illnesses, such as listeriosis, that can be very dangerous for your baby. Ask your health care provider for more information about listeriosis. What does 150 extra calories look like? Healthy options for an additional 150 calories each day could be any of the following:  Plain low-fat yogurt (6-8 oz) with  cup of berries.  1 apple with 2 teaspoons of peanut butter.  Cut-up vegetables with  cup of hummus.  Low-fat chocolate milk  (8 oz or 1 cup).  1 string cheese with 1 medium orange.   of a peanut butter and jelly sandwich on whole-wheat bread (1 tsp of peanut butter).  For 300 calories, you could eat two of those healthy options each day. What is a healthy amount of weight to gain? The recommended amount of weight for you to gain is based on your pre-pregnancy BMI. If your pre-pregnancy BMI was:  Less than 18 (underweight), you should gain 28-40 lb.  18-24.9 (normal), you should gain 25-35 lb.  25-29.9 (overweight), you should gain 15-25 lb.  Greater than 30 (obese), you should gain 11-20 lb.  What if I am having twins or multiples? Generally, pregnant women who will be having twins or multiples may need to increase their daily calories by 300-600 calories each day. The recommended range for total weight gain is 25-54 lb, depending on your pre-pregnancy BMI. Talk with your health care provider for specific guidance about additional nutritional needs, weight gain, and exercise during your pregnancy. What foods can I eat? Grains Any grains. Try to choose whole grains, such as whole-wheat bread, oatmeal, or Bisping rice. Vegetables Any vegetables. Try to eat a variety of colors and types of vegetables to get a full range of vitamins and minerals. Remember to wash your vegetables well before eating. Fruits Any fruits. Try to eat a variety of colors and types of fruit to get a full range of vitamins and minerals. Remember to wash your fruits well before eating. Meats and Other Protein Sources Lean meats, including chicken, [place], fish, and lean cuts of beef, veal,  or pork. Make sure that all meats are cooked to "well done." Tofu. Tempeh. Beans. Eggs. Peanut butter and other nut butters. Seafood, such as shrimp, crab, and lobster. If you choose fish, select types that are higher in omega-3 fatty acids, including salmon, herring, mussels, trout, sardines, and pollock. Make sure that all meats are cooked to food-safe  temperatures. Dairy Pasteurized milk and milk alternatives. Pasteurized yogurt and pasteurized cheese. Cottage cheese. Sour cream. Beverages Water. Juices that contain 100% fruit juice or vegetable juice. Caffeine-free teas and decaffeinated coffee. Drinks that contain caffeine are okay to drink, but it is better to avoid caffeine. Keep your total caffeine intake to less than 200 mg each day (12 oz of coffee, tea, or soda) or as directed by your health care provider. Condiments Any pasteurized condiments. Sweets and Desserts Any sweets and desserts. Fats and Oils Any fats and oils. The items listed above may not be a complete list of recommended foods or beverages. Contact your dietitian for more options. What foods are not recommended? Vegetables Unpasteurized (raw) vegetable juices. Fruits Unpasteurized (raw) fruit juices. Meats and Other Protein Sources Cured meats that have nitrates, such as bacon, salami, and hotdogs. Luncheon meats, bologna, or other deli meats (unless they are reheated until they are steaming hot). Refrigerated pate, meat spreads from a meat counter, smoked seafood that is found in the refrigerated section of a store. Raw fish, such as sushi or sashimi. High mercury content fish, such as tilefish, shark, swordfish, and king mackerel. Raw meats, such as tuna or beef tartare. Undercooked meats and poultry. Make sure that all meats are cooked to food-safe temperatures. Dairy Unpasteurized (raw) milk and any foods that have raw milk in them. Soft cheeses, such as feta, queso blanco, queso fresco, Brie, Camembert cheeses, blue-veined cheeses, and Panela cheese (unless it is made with pasteurized milk, which must be stated on the label). Beverages Alcohol. Sugar-sweetened beverages, such as sodas, teas, or energy drinks. Condiments Homemade fermented foods and drinks, such as pickles, sauerkraut, or kombucha drinks. (Store-bought pasteurized versions of these are  okay.) Other Salads that are made in the store, such as ham salad, chicken salad, egg salad, tuna salad, and seafood salad. The items listed above may not be a complete list of foods and beverages to avoid. Contact your dietitian for more information. This information is not intended to replace advice given to you by your health care provider. Make sure you discuss any questions you have with your health care provider. Document Released: 08/30/2014 Document Revised: 04/22/2016 Document Reviewed: 04/30/2014 Elsevier Interactive Patient Education  2018 [employer]. Common Medications Safe in Pregnancy  Acne:      Constipation:  Benzoyl Peroxide     Colace  Clindamycin      Dulcolax Suppository  Topica Erythromycin     Fibercon  Salicylic Acid      Metamucil         Miralax AVOID:        Senakot   Accutane    Cough:  Retin-A       Cough Drops  Tetracycline      Phenergan w/ Codeine if Rx  Minocycline      Robitussin (Plain & DM)  Antibiotics:     Crabs/Lice:  Ceclor       RID  Cephalosporins    AVOID:  E-Mycins      Kwell  Keflex  Macrobid/Macrodantin   Diarrhea:  Penicillin      Kao-Pectate  Zithromax  Imodium AD         PUSH FLUIDS AVOID:       Cipro     Fever:  Tetracycline      Tylenol (Regular or Extra  Minocycline       Strength)  Levaquin      Extra Strength-Do not          Exceed 8 tabs/24 hrs Caffeine:        <262m/day (equiv. To 1 cup of coffee or  approx. 3 12 oz sodas)         Gas: Cold/Hayfever:       Gas-X  Benadryl      Mylicon  Claritin       Phazyme  **Claritin-D        Chlor-Trimeton    Headaches:  Dimetapp      ASA-Free Excedrin  Drixoral-Non-Drowsy     Cold Compress  Mucinex (Guaifenasin)     Tylenol (Regular or Extra  Sudafed/Sudafed-12 Hour     Strength)  **Sudafed PE Pseudoephedrine   Tylenol Cold & Sinus     Vicks Vapor Rub  Zyrtec  **AVOID if Problems With Blood Pressure         Heartburn: Avoid lying down for at least 1 hour  after meals  Aciphex      Maalox     Rash:  Milk of Magnesia     Benadryl    Mylanta       1% Hydrocortisone Cream  Pepcid  Pepcid Complete   Sleep Aids:  Prevacid      Ambien   Prilosec       Benadryl  Rolaids       Chamomile Tea  Tums (Limit 4/day)     Unisom  Zantac       Tylenol PM         Warm milk-add vanilla or  Hemorrhoids:       Sugar for taste  Anusol/Anusol H.C.  (RX: Analapram 2.5%)  Sugar Substitutes:  Hydrocortisone OTC     Ok in moderation  Preparation H      Tucks        Vaseline lotion applied to tissue with wiping    Herpes:     Throat:  Acyclovir      Oragel  Famvir  Valtrex     Vaccines:         Flu Shot Leg Cramps:       *Gardasil  Benadryl      Hepatitis A         Hepatitis B Nasal Spray:       Pneumovax  Saline Nasal Spray     Polio Booster         Tetanus Nausea:       Tuberculosis test or PPD  Vitamin B6 25 mg TID   AVOID:    Dramamine      *Gardasil  Emetrol       Live Poliovirus  Ginger Root 250 mg QID    MMR (measles, mumps &  High Complex Carbs @ Bedtime    rebella)  Sea Bands-Accupressure    Varicella (Chickenpox)  Unisom 1/2 tab TID     *No known complications           If received before Pain:         Known pregnancy;   Darvocet       Resume series after  Lortab        Delivery  Percocet  Yeast:   Tramadol      Femstat  Tylenol 3      Gyne-lotrimin  Ultram       Monistat  Vicodin           MISC:         All Sunscreens           Hair Coloring/highlights          Insect Repellant's          (Including DEET)         Mystic Tans

## 2018-03-13 NOTE — Progress Notes (Signed)
NEW OB HISTORY AND PHYSICAL  SUBJECTIVE:       Stephanie Marquez is a 32 y.o. [phone] female, Patient's last menstrual period was 12/19/2017., Estimated Date of Delivery: 09/25/18, [id number], presents today for establishment of Prenatal Care. She has no unusual complaints and complains of nausea.     Gynecologic History Patient's last menstrual period was 12/19/2017. Normal Contraception: none Last Pap: 2016 . Results were: normal  Obstetric History OB History  Gravida Para Term Preterm AB Living  7 2 2   4 2   SAB TAB Ectopic Multiple Live Births  3   1   2     # Outcome Date GA Lbr Len/2nd Weight Sex Delivery Anes PTL Lv  7 Current           6 SAB 12/27/16          5 SAB 2016          4 Term 11/30/12    Judie PetitM Vag-Spont EPI  LIV  3 SAB 2014          2 Ectopic 2013          1 Term 08/13/10    M Vag-Spont EPI  LIV    Past Medical History:  Diagnosis Date  . Chronic kidney disease   . Kidney stones     Past Surgical History:  Procedure Laterality Date  . DILATION AND EVACUATION N/A 12/26/2016   Procedure: DILATATION AND EVACUATION;  Surgeon: Linzie Collinavid James Evans, MD;  Location: [facility]S;  Service: Gynecology;  Laterality: N/A;  . ECTOPIC PREGNANCY SURGERY      Current Outpatient Medications on File Prior to Visit  Medication Sig Dispense Refill  . Prenatal Vit-Fe Fumarate-FA (PRENATAL MULTIVITAMIN) TABS tablet Take 1 tablet by mouth daily at 12 noon.     No current facility-administered medications on file prior to visit.     Allergies  Allergen Reactions  . Bactrim [Sulfamethoxazole-Trimethoprim] Hives and Swelling    Social History   Socioeconomic History  . Marital status: Married    Spouse name: Not on file  . Number of children: Not on file  . Years of education: Not on file  . Highest education level: Not on file  Occupational History  . Not on file  Social Needs  . Financial resource strain: Not on file  . Food insecurity:    Worry: Not on file    Inability: Not  on file  . Transportation needs:    Medical: Not on file    Non-medical: Not on file  Tobacco Use  . Smoking status: Never Smoker  . Smokeless tobacco: Never Used  Substance and Sexual Activity  . Alcohol use: No  . Drug use: No  . Sexual activity: Yes    Birth control/protection: None  Lifestyle  . Physical activity:    Days per week: Not on file    Minutes per session: Not on file  . Stress: Not on file  Relationships  . Social connections:    Talks on phone: Not on file    Gets together: Not on file    Attends religious service: Not on file    Active member of club or organization: Not on file    Attends meetings of clubs or organizations: Not on file    Relationship status: Not on file  . Intimate partner violence:    Fear of current or ex partner: Not on file    Emotionally abused: Not on file    Physically abused:  Not on file    Forced sexual activity: Not on file  Other Topics Concern  . Not on file  Social History Narrative  . Not on file    Family History  Problem Relation Age of Onset  . Ovarian cancer Mother   . Rheum arthritis Father     The following portions of the patient's history were reviewed and updated as appropriate: allergies, current medications, past OB history, past medical history, past surgical history, past family history, past social history, and problem list.    OBJECTIVE: Initial Physical Exam (New OB)  GENERAL APPEARANCE: alert, well appearing, in no apparent distress, oriented to person, place and time HEAD: normocephalic, atraumatic MOUTH: mucous membranes moist, pharynx normal without lesions THYROID: no thyromegaly or masses present BREASTS: no masses noted, no significant tenderness, no palpable axillary nodes, no skin changes LUNGS: clear to auscultation, no wheezes, rales or rhonchi, symmetric air entry HEART: regular rate and rhythm, no murmurs ABDOMEN: soft, nontender, nondistended, no abnormal masses, no epigastric  pain, fundus not palpable, fundus soft, nontender 12 weeks size and FHT present EXTREMITIES: no redness or tenderness in the calves or thighs, no edema, no limitation in range of motion, intact peripheral pulses SKIN: normal coloration and turgor, no rashes LYMPH NODES: no adenopathy palpable NEUROLOGIC: alert, oriented, normal speech, no focal findings or movement disorder noted PELVIC EXAM EXTERNAL GENITALIA: normal appearing vulva with no masses, tenderness or lesions VAGINA: no abnormal discharge or lesions CERVIX: no lesions or cervical motion tenderness and contact bleeding with pap UTERUS: gravid ADNEXA: no masses palpable and nontender OB EXAM PELVIMETRY: appears adequate RECTUM: exam not indicated  ASSESSMENT: Normal pregnancy History of Multiple miscarriages  PLAN: Prenatal Care Pap Collected [place] ordered New OB counseling: The patient has been given an overview regarding routine prenatal care. Recommendations regarding diet, weight gain, and exercise in pregnancy were given. Prenatal testing, optional genetic testing, and ultrasound use in pregnancy were reviewed.  Follow up in 4 weeks.  Rondel Jumbo, SNM/ Doreene Burke, [place]

## 2018-03-14 ENCOUNTER — Telehealth: Payer: Self-pay

## 2018-03-14 NOTE — Telephone Encounter (Signed)
Pt informed there is no genetic testing that is only for gender. Pt expressed understanding.

## 2018-03-14 NOTE — Telephone Encounter (Signed)
-----   Message from Doreene BurkeAnnie Thompson, [place] sent at 03/13/2018  4:26 PM EDT ----- Pt is interested in genetic testing for gender only. Can you find out if that is a possibility and let her know?  Thanks Pattricia BossAnnie

## 2018-03-15 ENCOUNTER — Encounter (INDEPENDENT_AMBULATORY_CARE_PROVIDER_SITE_OTHER): Payer: Self-pay

## 2018-03-15 LAB — PAP IG AND HPV HIGH-RISK
HPV, high-risk: NEGATIVE
PAP SMEAR COMMENT: 0

## 2018-04-12 ENCOUNTER — Encounter: Payer: Self-pay | Admitting: Obstetrics and Gynecology

## 2018-04-12 ENCOUNTER — Ambulatory Visit (INDEPENDENT_AMBULATORY_CARE_PROVIDER_SITE_OTHER): Payer: Medicaid Other | Admitting: Obstetrics and Gynecology

## 2018-04-12 VITALS — BP 117/76 | HR 83 | Wt 142.9 lb

## 2018-04-12 DIAGNOSIS — Z3492 Encounter for supervision of normal pregnancy, unspecified, second trimester: Secondary | ICD-10-CM

## 2018-04-12 NOTE — Progress Notes (Signed)
ROB-reviewed labs, anatomy scan next visit. Doing well. 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

## 2018-04-12 NOTE — Progress Notes (Signed)
ROB- pt is doing well 

## 2018-05-11 ENCOUNTER — Ambulatory Visit (INDEPENDENT_AMBULATORY_CARE_PROVIDER_SITE_OTHER): Payer: Medicaid Other | Admitting: Certified Nurse Midwife

## 2018-05-11 ENCOUNTER — Ambulatory Visit (INDEPENDENT_AMBULATORY_CARE_PROVIDER_SITE_OTHER): Payer: Medicaid Other

## 2018-05-11 ENCOUNTER — Encounter: Payer: Medicaid Other | Admitting: Certified Nurse Midwife

## 2018-05-11 VITALS — BP 100/68 | HR 89 | Wt 151.2 lb

## 2018-05-11 DIAGNOSIS — Z3492 Encounter for supervision of normal pregnancy, unspecified, second trimester: Secondary | ICD-10-CM

## 2018-05-11 DIAGNOSIS — I129 Hypertensive chronic kidney disease with stage 1 through stage 4 chronic kidney disease, or unspecified chronic kidney disease: Secondary | ICD-10-CM

## 2018-05-11 DIAGNOSIS — O10212 Pre-existing hypertensive chronic kidney disease complicating pregnancy, second trimester: Secondary | ICD-10-CM

## 2018-05-11 LAB — POCT URINALYSIS DIPSTICK
BILIRUBIN UA: NEGATIVE
Blood, UA: NEGATIVE
GLUCOSE UA: NEGATIVE
Ketones, UA: NEGATIVE
LEUKOCYTES UA: NEGATIVE
Nitrite, UA: NEGATIVE
Protein, UA: NEGATIVE
Spec Grav, UA: 1.025 (ref 1.010–1.025)
Urobilinogen, UA: 0.2 E.U./dL
pH, UA: 6 (ref 5.0–8.0)

## 2018-05-11 MED ORDER — ASPIRIN EC 81 MG PO TBEC: 81.0000 mg | DELAYED_RELEASE_TABLET | Freq: Every day | ORAL | 2 refills | Status: DC

## 2018-05-11 NOTE — Progress Notes (Signed)
Stephanie Marquez-Doing well, nausea and vomiting have resolved. Anatomy scan today complete and WNL; findings reviewed with patient. Discussed management of CKD in pregnancy; plan reviewed with Dr. Valentino Saxonherry and patient. Rx Aspirin, see orders. CBC, CMP, and protein/creatinine ratio today and each trimester. Anticipatory guidance regarding prenatal care. Reviewed red flag symptoms and when to call. RTC x 4 weeks for Stephanie Marquez or sooner if needed.   ULTRASOUND REPORT  Location: [facility] Date of Service:  05/11/2018  Indications: Anatomy Findings:  Singleton intrauterine pregnancy is visualized with FHR at 137 BPM. Biometrics give an (U/S) Gestational age of 32 2/7 weeks and an (U/S) EDD of 09/26/18; this correlates with the clinically established EDD of 09/25/18.  Fetal presentation is breech.  EFW: 359 grams (0lb 13oz). Placenta: Anterior and Posterior C-shaped and grade 1. AFI: WNL subjectively.  Anatomic survey is complete and appears WNL; Gender - Female.   Right Ovary measures 2.7 x 2.4 x 1.8 cm. It is normal in appearance. Left Ovary measures 1.9 x 1.7 x 1.3 cm. It is normal appearance. There is no obvious evidence of a corpus luteal cyst. Survey of the adnexa demonstrates no adnexal masses. There is no free peritoneal fluid in the cul de sac.  Impression: 1. 20 2/7 week Viable Singleton Intrauterine pregnancy by U/S. 2. (U/S) EDD is consistent with Clinically established (LMP) EDD of 09/25/18. 3. Normal Anatomy Scan  Recommendations: 1.Clinical correlation with the patient's History and Physical Exam.

## 2018-05-11 NOTE — Patient Instructions (Signed)

## 2018-05-11 NOTE — Progress Notes (Signed)
Pt is here for an ROB visit. 

## 2018-05-12 LAB — PROTEIN / CREATININE RATIO, URINE
CREATININE, UR: 34 mg/dL
PROTEIN/CREAT RATIO: 168 mg/g{creat} (ref 0–200)
Protein, Ur: 5.7 mg/dL

## 2018-05-12 LAB — COMPREHENSIVE METABOLIC PANEL
ALK PHOS: 64 IU/L (ref 39–117)
ALT: 10 IU/L (ref 0–32)
AST: 12 IU/L (ref 0–40)
Albumin/Globulin Ratio: 1.6 (ref 1.2–2.2)
Albumin: 4 g/dL (ref 3.5–5.5)
BUN/Creatinine Ratio: 9 (ref 9–23)
BUN: 5 mg/dL — AB (ref 6–20)
CO2: 20 mmol/L (ref 20–29)
Calcium: 9.4 mg/dL (ref 8.7–10.2)
Chloride: 104 mmol/L (ref 96–106)
Creatinine, Ser: 0.54 mg/dL — ABNORMAL LOW (ref 0.57–1.00)
GFR calc non Af Amer: 125 mL/min/{1.73_m2} (ref >59)
GFR, EST AFRICAN AMERICAN: 144 mL/min/{1.73_m2} (ref >59)
GLUCOSE: 87 mg/dL (ref 65–99)
Globulin, Total: 2.5 g/dL (ref 1.5–4.5)
Potassium: 3.9 mmol/L (ref 3.5–5.2)
Sodium: 138 mmol/L (ref 134–144)
Total Protein: 6.5 g/dL (ref 6.0–8.5)

## 2018-05-12 LAB — CBC
Hematocrit: 33.5 % — ABNORMAL LOW (ref 34.0–46.6)
Hemoglobin: 11.9 g/dL (ref 11.1–15.9)
MCH: 31.7 pg (ref 26.6–33.0)
MCHC: 35.5 g/dL (ref 31.5–35.7)
MCV: 89 fL (ref 79–97)
PLATELETS: 204 10*3/uL (ref 150–450)
RBC: 3.75 x10E6/uL — AB (ref 3.77–5.28)
RDW: 14.1 % (ref 12.3–15.4)
WBC: 9 10*3/uL (ref 3.4–10.8)

## 2018-05-17 ENCOUNTER — Encounter: Payer: Self-pay | Admitting: Certified Nurse Midwife

## 2018-06-07 ENCOUNTER — Ambulatory Visit (INDEPENDENT_AMBULATORY_CARE_PROVIDER_SITE_OTHER): Payer: Medicaid Other | Admitting: Certified Nurse Midwife

## 2018-06-07 VITALS — BP 112/61 | HR 94 | Wt 156.1 lb

## 2018-06-07 DIAGNOSIS — Z3492 Encounter for supervision of normal pregnancy, unspecified, second trimester: Secondary | ICD-10-CM

## 2018-06-07 DIAGNOSIS — Z3482 Encounter for supervision of other normal pregnancy, second trimester: Secondary | ICD-10-CM

## 2018-06-07 DIAGNOSIS — Z3A28 28 weeks gestation of pregnancy: Secondary | ICD-10-CM

## 2018-06-07 DIAGNOSIS — Z3A24 24 weeks gestation of pregnancy: Secondary | ICD-10-CM

## 2018-06-07 LAB — POCT URINALYSIS DIPSTICK
BILIRUBIN UA: NEGATIVE
GLUCOSE UA: NEGATIVE
Ketones, UA: NEGATIVE
LEUKOCYTES UA: NEGATIVE
Nitrite, UA: NEGATIVE
Protein, UA: POSITIVE — AB
RBC UA: NEGATIVE
SPEC GRAV UA: 1.02 (ref 1.010–1.025)
Urobilinogen, UA: 0.2 E.U./dL
pH, UA: 6 (ref 5.0–8.0)

## 2018-06-07 NOTE — Patient Instructions (Signed)

## 2018-06-07 NOTE — Progress Notes (Signed)
Pt is here for an ROB visit. 

## 2018-06-07 NOTE — Progress Notes (Signed)
ROB, doing well. Feels good movement. Anticipatory guidance for 28 wk visit u/s growth at next visit due to chronic kidney disease as well as repeat labs (CMP/ pc ratio, cbc). Follow up 4 wks.   Doreene BurkeAnnie Jaquelin Meaney, CNM

## 2018-07-11 ENCOUNTER — Other Ambulatory Visit: Payer: Medicaid Other

## 2018-07-11 ENCOUNTER — Ambulatory Visit (INDEPENDENT_AMBULATORY_CARE_PROVIDER_SITE_OTHER): Payer: Medicaid Other | Admitting: Obstetrics and Gynecology

## 2018-07-11 VITALS — BP 110/60 | HR 98 | Wt 160.3 lb

## 2018-07-11 DIAGNOSIS — Z3493 Encounter for supervision of normal pregnancy, unspecified, third trimester: Secondary | ICD-10-CM

## 2018-07-11 DIAGNOSIS — Z131 Encounter for screening for diabetes mellitus: Secondary | ICD-10-CM

## 2018-07-11 DIAGNOSIS — Z130 Encounter for screening for diseases of the blood and blood-forming organs and certain disorders involving the immune mechanism: Secondary | ICD-10-CM

## 2018-07-11 DIAGNOSIS — Z3A28 28 weeks gestation of pregnancy: Secondary | ICD-10-CM

## 2018-07-11 LAB — POCT URINALYSIS DIPSTICK
Bilirubin, UA: NEGATIVE
Blood, UA: NEGATIVE
GLUCOSE UA: NEGATIVE
Ketones, UA: NEGATIVE
LEUKOCYTES UA: NEGATIVE
Nitrite, UA: NEGATIVE
Protein, UA: NEGATIVE
SPEC GRAV UA: 1.015 (ref 1.010–1.025)
Urobilinogen, UA: 0.2 E.U./dL
pH, UA: 7 (ref 5.0–8.0)

## 2018-07-11 NOTE — Progress Notes (Signed)
Glucola & ROB- doing well, plans BTL papers signed today,

## 2018-07-11 NOTE — Patient Instructions (Signed)
Laparoscopic Tubal Ligation Laparoscopic tubal ligation is a procedure to close the fallopian tubes. This is done so that you cannot get pregnant. When the fallopian tubes are closed, the eggs that your ovaries release cannot enter the uterus, and sperm cannot reach the released eggs. A laparoscopic tubal ligation is sometimes called "getting your tubes tied." You should not have this procedure if you want to get pregnant someday or if you are unsure about having more children. Tell a health care provider about:  Any allergies you have.  All medicines you are taking, including vitamins, herbs, eye drops, creams, and over-the-counter medicines.  Any problems you or family members have had with anesthetic medicines.  Any blood disorders you have.  Any surgeries you have had.  Any medical conditions you have.  Whether you are pregnant or may be pregnant.  Any past pregnancies. What are the risks? Generally, this is a safe procedure. However, problems may occur, including:  Infection.  Bleeding.  Injury to surrounding organs.  Side effects from anesthetics.  Failure of the procedure.  This procedure can increase your risk of a kind of pregnancy in which a fertilized egg attaches to the outside of the uterus (ectopic pregnancy). What happens before the procedure?  Ask your health care provider about: ? Changing or stopping your regular medicines. This is especially important if you are taking diabetes medicines or blood thinners. ? Taking medicines such as aspirin and ibuprofen. These medicines can thin your blood. Do not take these medicines before your procedure if your health care provider instructs you not to.  Follow instructions from your health care provider about eating and drinking restrictions.  Plan to have someone take you home after the procedure.  If you go home right after the procedure, plan to have someone with you for 24 hours. What happens during the  procedure?  You will be given one or more of the following: ? A medicine to help you relax (sedative). ? A medicine to numb the area (local anesthetic). ? A medicine to make you fall asleep (general anesthetic). ? A medicine that is injected into an area of your body to numb everything below the injection site (regional anesthetic).  An IV tube will be inserted into one of your veins. It will be used to give you medicines and fluids during the procedure.  Your bladder may be emptied with a small tube (catheter).  If you have been given a general anesthetic, a tube will be put down your throat to help you breathe.  Two small cuts (incisions) will be made in your lower abdomen and near your belly button.  Your abdomen will be inflated with a gas. This will let the surgeon see better and will give the surgeon room to work.  A thin, lighted tube (laparoscope) with a camera attached will be inserted into your abdomen through one of the incisions. Small instruments will be inserted through the other incision.  The fallopian tubes will be tied off, burned (cauterized), or blocked with a clip, ring, or clamp. A small portion in the center of each fallopian tube may be removed.  The gas will be released from the abdomen.  The incisions will be closed with stitches (sutures).  A bandage (dressing) will be placed over the incisions. The procedure may vary among health care providers and hospitals. What happens after the procedure?  Your blood pressure, heart rate, breathing rate, and blood oxygen level will be monitored often until the medicines you   were given have worn off.  You will be given medicine to help with pain, nausea, and vomiting as needed. This information is not intended to replace advice given to you by your health care provider. Make sure you discuss any questions you have with your health care provider. Document Released: 02/21/2001 Document Revised: 04/22/2016 Document  Reviewed: 10/26/2015 Elsevier Interactive Patient Education  [employer]2018 Else[employer] Inc.    Third Trimester of Pregnancy The third trimester is from week 29 through week 42, months 7 through 9. This trimester is when your unborn baby (fetus) is growing very fast. At the end of the ninth month, the unborn baby is about 20 inches in length. It weighs about 6-10 pounds. Follow these instructions at home:  Avoid all smoking, herbs, and alcohol. Avoid drugs not approved by your doctor.  Do not use any tobacco products, including cigarettes, chewing tobacco, and electronic cigarettes. If you need help quitting, ask your doctor. You may get counseling or other support to help you quit.  Only take medicine as told by your doctor. Some medicines are safe and some are not during pregnancy.  Exercise only as told by your doctor. Stop exercising if you start having cramps.  Eat regular, healthy meals.  Wear a good support bra if your breasts are tender.  Do not use hot tubs, steam rooms, or saunas.  Wear your seat belt when driving.  Avoid raw meat, uncooked cheese, and liter boxes and soil used by cats.  Take your prenatal vitamins.  Take 1500-2000 milligrams of calcium daily starting at the 20th week of pregnancy until you deliver your baby.  Try taking medicine that helps you poop (stool softener) as needed, and if your doctor approves. Eat more fiber by eating fresh fruit, vegetables, and whole grains. Drink enough fluids to keep your pee (urine) clear or pale yellow.  Take warm water baths (sitz baths) to soothe pain or discomfort caused by hemorrhoids. Use hemorrhoid cream if your doctor approves.  If you have puffy, bulging veins (varicose veins), wear support hose. Raise (elevate) your feet for 15 minutes, 3-4 times a day. Limit salt in your diet.  Avoid heavy lifting, wear low heels, and sit up straight.  Rest with your legs raised if you have leg cramps or low back pain.  Visit your  dentist if you have not gone during your pregnancy. Use a soft toothbrush to brush your teeth. Be gentle when you floss.  You can have sex (intercourse) unless your doctor tells you not to.  Do not travel far distances unless you must. Only do so with your doctor's approval.  Take prenatal classes.  Practice driving to the hospital.  Pack your hospital bag.  Prepare the baby's room.  Go to your doctor visits. Get help if:  You are not sure if you are in labor or if your water has broken.  You are dizzy.  You have mild cramps or pressure in your lower belly (abdominal).  You have a nagging pain in your belly area.  You continue to feel sick to your stomach (nauseous), throw up (vomit), or have watery poop (diarrhea).  You have bad smelling fluid coming from your vagina.  You have pain with peeing (urination). Get help right away if:  You have a fever.  You are leaking fluid from your vagina.  You are spotting or bleeding from your vagina.  You have severe belly cramping or pain.  You lose or gain weight rapidly.  You have  trouble catching your breath and have chest pain.  You notice sudden or extreme puffiness (swelling) of your face, hands, ankles, feet, or legs.  You have not felt the baby move in over an hour.  You have severe headaches that do not go away with medicine.  You have vision changes. This information is not intended to replace advice given to you by your health care provider. Make sure you discuss any questions you have with your health care provider. Document Released: 02/09/2010 Document Revised: 04/22/2016 Document Reviewed: 01/16/2013 Elsevier Interactive Patient Education  2017 [employer]

## 2018-07-11 NOTE — Progress Notes (Signed)
ROB- glucola done, blood consent signed, pt is doing well 

## 2018-07-12 LAB — COMPREHENSIVE METABOLIC PANEL
A/G RATIO: 1.5 (ref 1.2–2.2)
ALBUMIN: 3.5 g/dL (ref 3.5–5.5)
ALK PHOS: 75 IU/L (ref 39–117)
ALT: 13 IU/L (ref 0–32)
AST: 17 IU/L (ref 0–40)
BILIRUBIN TOTAL: 0.2 mg/dL (ref 0.0–1.2)
BUN / CREAT RATIO: 6 — AB (ref 9–23)
BUN: 4 mg/dL — ABNORMAL LOW (ref 6–20)
CO2: 19 mmol/L — ABNORMAL LOW (ref 20–29)
CREATININE: 0.63 mg/dL (ref 0.57–1.00)
Calcium: 8.9 mg/dL (ref 8.7–10.2)
Chloride: 107 mmol/L — ABNORMAL HIGH (ref 96–106)
GFR calc Af Amer: 137 mL/min/{1.73_m2} (ref >59)
GFR calc non Af Amer: 119 mL/min/{1.73_m2} (ref >59)
GLOBULIN, TOTAL: 2.3 g/dL (ref 1.5–4.5)
Glucose: 100 mg/dL — ABNORMAL HIGH (ref 65–99)
POTASSIUM: 3.8 mmol/L (ref 3.5–5.2)
SODIUM: 139 mmol/L (ref 134–144)
Total Protein: 5.8 g/dL — ABNORMAL LOW (ref 6.0–8.5)

## 2018-07-12 LAB — PROTEIN / CREATININE RATIO, URINE
CREATININE, UR: 152.4 mg/dL
Protein, Ur: 30.8 mg/dL
Protein/Creat Ratio: 202 mg/g creat — ABNORMAL HIGH (ref 0–200)

## 2018-07-12 LAB — GLUCOSE TOLERANCE, 1 HOUR: Glucose, 1Hr PP: 96 mg/dL (ref 65–199)

## 2018-07-25 ENCOUNTER — Ambulatory Visit (INDEPENDENT_AMBULATORY_CARE_PROVIDER_SITE_OTHER): Payer: Medicaid Other

## 2018-07-25 ENCOUNTER — Ambulatory Visit (INDEPENDENT_AMBULATORY_CARE_PROVIDER_SITE_OTHER): Payer: Medicaid Other | Admitting: Certified Nurse Midwife

## 2018-07-25 VITALS — BP 109/67 | HR 93 | Wt 163.1 lb

## 2018-07-25 DIAGNOSIS — Z3A33 33 weeks gestation of pregnancy: Secondary | ICD-10-CM | POA: Diagnosis not present

## 2018-07-25 DIAGNOSIS — Z3493 Encounter for supervision of normal pregnancy, unspecified, third trimester: Secondary | ICD-10-CM | POA: Diagnosis not present

## 2018-07-25 DIAGNOSIS — Z3A28 28 weeks gestation of pregnancy: Secondary | ICD-10-CM

## 2018-07-25 DIAGNOSIS — Z3483 Encounter for supervision of other normal pregnancy, third trimester: Secondary | ICD-10-CM

## 2018-07-25 LAB — POCT URINALYSIS DIPSTICK
BILIRUBIN UA: NEGATIVE
Blood, UA: NEGATIVE
Glucose, UA: NEGATIVE
Ketones, UA: NEGATIVE
Leukocytes, UA: NEGATIVE
Nitrite, UA: NEGATIVE
PH UA: 6 (ref 5.0–8.0)
PROTEIN UA: NEGATIVE
Spec Grav, UA: 1.01 (ref 1.010–1.025)
UROBILINOGEN UA: 0.2 U/dL

## 2018-07-25 NOTE — Progress Notes (Signed)
Pt is here for an ROB visit. 

## 2018-07-25 NOTE — Progress Notes (Signed)
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 Growth US today wnl, findings reviewed with patient and she verbalized understanding. Anticipatory guidance regarding course of prenatal care including 36 week cultures and induction in the 39 week. Reviewed red flag symptoms and when to call. RTC x 2 weeks for ROB or sooner if needed.   ULTRASOUND REPORT  Location: [facility] Date of Service:  07/25/2018  Indications: Growth; Chronic Kidney Disease Findings:  Mason JimSingleton intrauterine pregnancy is visualized with FHR at 157 BPM. Biometrics give an (U/S) Gestational age of 733 weeks and an (U/S) EDD of 09/12/18; this correlates with the clinically established EDD of 09/25/18.  Fetal presentation is vertex.  EFW: 2129 grams (4lb 11oz).  72nd percentile.  BPD measures 90th, HC measures 75th, AC measures 99th, and FL measures 39th percentile. Placenta: Anterior and grade 1-2. AFI: 11.4 cm.  Fetal stomach, kidneys, and bladder appear WNL.  Impression: 1. 33 week Viable Singleton Intrauterine pregnancy by U/S. 2. (U/S) EDD is consistent with Clinically established (LMP) EDD of 09/25/18. 3. EFW: 2129 grams (4lb 11oz).  72nd percentile.  Individual measurements stated above.  Recommendations: 1.Clinical correlation with the patient's History and Physical Exam.

## 2018-07-25 NOTE — Patient Instructions (Signed)

## 2018-08-09 ENCOUNTER — Ambulatory Visit (INDEPENDENT_AMBULATORY_CARE_PROVIDER_SITE_OTHER): Payer: Medicaid Other | Admitting: Certified Nurse Midwife

## 2018-08-09 VITALS — BP 86/56 | HR 87 | Wt 164.6 lb

## 2018-08-09 DIAGNOSIS — Z3493 Encounter for supervision of normal pregnancy, unspecified, third trimester: Secondary | ICD-10-CM | POA: Diagnosis not present

## 2018-08-09 LAB — POCT URINALYSIS DIPSTICK OB
BILIRUBIN UA: NEGATIVE
GLUCOSE, UA: NEGATIVE
Ketones, UA: NEGATIVE
LEUKOCYTES UA: NEGATIVE
Nitrite, UA: NEGATIVE
POC,PROTEIN,UA: NEGATIVE
Urobilinogen, UA: 0.2 E.U./dL
pH, UA: 7 (ref 5.0–8.0)

## 2018-08-09 NOTE — Progress Notes (Signed)
ROB doing well. Discussed GBS at 36 wks. She feels good movement. Denies contractions. Follow up 2 wks. Then in 1 wk for cultures.   Doreene Burke, CNM

## 2018-08-09 NOTE — Patient Instructions (Signed)
Td Vaccine (Tetanus and Diphtheria): What You Need to Know 1. Why get vaccinated? Tetanus  and diphtheria are very serious diseases. They are rare in the [place] today, but people who do become infected often have severe complications. Td vaccine is used to protect adolescents and adults from both of these diseases. Both tetanus and diphtheria are infections caused by bacteria. Diphtheria spreads from person to person through coughing or sneezing. Tetanus-causing bacteria enter the body through cuts, scratches, or wounds. TETANUS (lockjaw) causes painful muscle tightening and stiffness, usually all over the body.  It can lead to tightening of muscles in the head and neck so you can't open your mouth, swallow, or sometimes even breathe. Tetanus kills about 1 out of every 10 people who are infected even after receiving the best medical care.  DIPHTHERIA can cause a thick coating to form in the back of the throat.  It can lead to breathing problems, paralysis, heart failure, and death.  Before vaccines, as many as 200,000 cases of diphtheria and hundreds of cases of tetanus were reported in the [place] each year. Since vaccination began, reports of cases for both diseases have dropped by about 99%. 2. Td vaccine Td vaccine can protect adolescents and adults from tetanus and diphtheria. Td is usually given as a booster dose every 10 years but it can also be given earlier after a severe and dirty wound or burn. Another vaccine, called Tdap, which protects against pertussis in addition to tetanus and diphtheria, is sometimes recommended instead of Td vaccine. Your doctor or the person giving you the vaccine can give you more information. Td may safely be given at the same time as other vaccines. 3. Some people should not get this vaccine  A person who has ever had a life-threatening allergic reaction after a previous dose of any tetanus or diphtheria containing vaccine, OR has a severe  allergy to any part of this vaccine, should not get Td vaccine. Tell the person giving the vaccine about any severe allergies.  Talk to your doctor if you: ? had severe pain or swelling after any vaccine containing diphtheria or tetanus, ? ever had a condition called Guillain Barre Syndrome (GBS), ? aren't feeling well on the day the shot is scheduled. 4. What are the risks from Td vaccine? With any medicine, including vaccines, there is a chance of side effects. These are usually mild and go away on their own. Serious reactions are also possible but are rare. Most people who get Td vaccine do not have any problems with it. Mild problems following Td vaccine: (Did not interfere with activities)  Pain where the shot was given (about 8 people in 10)  Redness or swelling where the shot was given (about 1 person in 4)  Mild fever (rare)  Headache (about 1 person in 4)  Tiredness (about 1 person in 4)  Moderate problems following Td vaccine: (Interfered with activities, but did not require medical attention)  Fever over 102F (rare)  Severe problems following Td vaccine: (Unable to perform usual activities; required medical attention)  Swelling, severe pain, bleeding and/or redness in the arm where the shot was given (rare).  Problems that could happen after any vaccine:  People sometimes faint after a medical procedure, including vaccination. Sitting or lying down for about 15 minutes can help prevent fainting, and injuries caused by a fall. Tell your doctor if you feel dizzy, or have vision changes or ringing in the ears.  Some people get   severe pain in the shoulder and have difficulty moving the arm where a shot was given. This happens very rarely.  Any medication can cause a severe allergic reaction. Such reactions from a vaccine are very rare, estimated at fewer than 1 in a million doses, and would happen within a few minutes to a few hours after the vaccination. As with any  medicine, there is a very remote chance of a vaccine causing a serious injury or death. The safety of vaccines is always being monitored. For more information, visit: http://floyd.org/ 5. What if there is a serious reaction? What should I look for? Look for anything that concerns you, such as signs of a severe allergic reaction, very high fever, or unusual behavior. Signs of a severe allergic reaction can include hives, swelling of the face and throat, difficulty breathing, a fast heartbeat, dizziness, and weakness. These would usually start a few minutes to a few hours after the vaccination. What should I do?  If you think it is a severe allergic reaction or other emergency that can't wait, call 9-1-1 or get the person to the nearest hospital. Otherwise, call your doctor.  Afterward, the reaction should be reported to the Vaccine Adverse Event Reporting System (VAERS). Your doctor might file this report, or you can do it yourself through the VAERS web site at www.vaers.LAgents.no, or by calling 1-(424) 543-9300. ? VAERS does not give medical advice. 6. The National Vaccine Injury Compensation Program The [employer] Vaccine Injury Compensation Program (VICP) is a federal program that was created to compensate people who may have been injured by certain vaccines. Persons who believe they may have been injured by a vaccine can learn about the program and about filing a claim by calling 1-862-286-3258 or visiting the VICP website at SpiritualWord.at. There is a time limit to file a claim for compensation. 7. How can I learn more?  Ask your doctor. He or she can give you the vaccine package insert or suggest other sources of information.  Call your local or state health department.  Contact the Centers for Disease Control and Prevention (CDC): ? Call 971-697-2830 (1-800-CDC-INFO) ? Visit CDC's website at PicCapture.uy CDC Td Vaccine VIS (03/09/16) This information is  not intended to replace advice given to you by your health care provider. Make sure you discuss any questions you have with your health care provider. Document Released: 09/12/2006 Document Revised: 08/05/2016 Document Reviewed: 08/05/2016 Elsevier Interactive Patient Education  2017 [employer]. How a Baby Grows During Pregnancy Pregnancy begins when a female's sperm enters a female's egg (fertilization). This happens in one of the tubes (fallopian tubes) that connect the ovaries to the womb (uterus). The fertilized egg is called an embryo until it reaches 10 weeks. From 10 weeks until birth, it is called a fetus. The fertilized egg moves down the fallopian tube to the uterus. Then it implants into the lining of the uterus and begins to grow. The developing fetus receives oxygen and nutrients through the pregnant woman's bloodstream and the tissues that grow (placenta) to support the fetus. The placenta is the life support system for the fetus. It provides nutrition and removes waste. Learning as much as you can about your pregnancy and how your baby is developing can help you enjoy the experience. It can also make you aware of when there might be a problem and when to ask questions. How long does a typical pregnancy last? A pregnancy usually lasts 280 days, or about 40 weeks. Pregnancy is divided into  three trimesters:  First trimester: 0-13 weeks.  Second trimester: 14-27 weeks.  Third trimester: 28-40 weeks.  The day when your baby is considered ready to be born (full term) is your estimated date of delivery. How does my baby develop month by month? First month  The fertilized egg attaches to the inside of the uterus.  Some cells will form the placenta. Others will form the fetus.  The arms, legs, brain, spinal cord, lungs, and heart begin to develop.  At the end of the first month, the heart begins to beat.  Second month  The bones, inner ear, eyelids, hands, and feet form.  The  genitals develop.  By the end of 8 weeks, all major organs are developing.  Third month  All of the internal organs are forming.  Teeth develop below the gums.  Bones and muscles begin to grow. The spine can flex.  The skin is transparent.  Fingernails and toenails begin to form.  Arms and legs continue to grow longer, and hands and feet develop.  The fetus is about 3 in (7.6 cm) long.  Fourth month  The placenta is completely formed.  The external sex organs, neck, outer ear, eyebrows, eyelids, and fingernails are formed.  The fetus can hear, swallow, and move its arms and legs.  The kidneys begin to produce urine.  The skin is covered with a white waxy coating (vernix) and very fine hair (lanugo).  Fifth month  The fetus moves around more and can be felt for the first time (quickening).  The fetus starts to sleep and wake up and may begin to suck its finger.  The nails grow to the end of the fingers.  The organ in the digestive system that makes bile (gallbladder) functions and helps to digest the nutrients.  If your baby is a girl, eggs are present in her ovaries. If your baby is a boy, testicles start to move down into his scrotum.  Sixth month  The lungs are formed, but the fetus is not yet able to breathe.  The eyes open. The brain continues to develop.  Your baby has fingerprints and toe prints. Your baby's hair grows thicker.  At the end of the second trimester, the fetus is about 9 in (22.9 cm) long.  Seventh month  The fetus kicks and stretches.  The eyes are developed enough to sense changes in light.  The hands can make a grasping motion.  The fetus responds to sound.  Eighth month  All organs and body systems are fully developed and functioning.  Bones harden and taste buds develop. The fetus may hiccup.  Certain areas of the brain are still developing. The skull remains soft.  Ninth month  The fetus gains about  lb (0.23 kg)  each week.  The lungs are fully developed.  Patterns of sleep develop.  The fetus's head typically moves into a head-down position (vertex) in the uterus to prepare for birth. If the buttocks move into a vertex position instead, the baby is breech.  The fetus weighs 6-9 lbs (2.72-4.08 kg) and is 19-20 in (48.26-50.8 cm) long.  What can I do to have a healthy pregnancy and help my baby develop? Eating and Drinking  Eat a healthy diet. ? Talk with your health care provider to make sure that you are getting the nutrients that you and your baby need. ? Visit www.DisposableNylon.be to learn about creating a healthy diet.  Gain a healthy amount of weight during pregnancy  as advised by your health care provider. This is usually 25-35 pounds. You may need to: ? Gain more if you were underweight before getting pregnant or if you are pregnant with more than one baby. ? Gain less if you were overweight or obese when you got pregnant.  Medicines and Vitamins  Take prenatal vitamins as directed by your health care provider. These include vitamins such as folic acid, iron, calcium, and vitamin D. They are important for healthy development.  Take medicines only as directed by your health care provider. Read labels and ask a pharmacist or your health care provider whether over-the-counter medicines, supplements, and prescription drugs are safe to take during pregnancy.  Activities  Be physically active as advised by your health care provider. Ask your health care provider to recommend activities that are safe for you to do, such as walking or swimming.  Do not participate in strenuous or extreme sports.  Lifestyle  Do not drink alcohol.  Do not use any tobacco products, including cigarettes, chewing tobacco, or electronic cigarettes. If you need help quitting, ask your health care provider.  Do not use illegal drugs.  Safety  Avoid exposure to mercury, lead, or other heavy metals. Ask your  health care provider about common sources of these heavy metals.  Avoid listeria infection during pregnancy. Follow these precautions: ? Do not eat soft cheeses or deli meats. ? Do not eat hot dogs unless they have been warmed up to the point of steaming, such as in the microwave oven. ? Do not drink unpasteurized milk.  Avoid toxoplasmosis infection during pregnancy. Follow these precautions: ? Do not change your cat's litter box, if you have a cat. Ask someone else to do this for you. ? Wear gardening gloves while working in the yard.  General Instructions  Keep all follow-up visits as directed by your health care provider. This is important. This includes prenatal care and screening tests.  Manage any chronic health conditions. Work closely with your health care provider to keep conditions, such as diabetes, under control.  How do I know if my baby is developing well? At each prenatal visit, your health care provider will do several different tests to check on your health and keep track of your baby's development. These include:  Fundal height. ? Your health care provider will measure your growing belly from top to bottom using a tape measure. ? Your health care provider will also feel your belly to determine your baby's position.  Heartbeat. ? An ultrasound in the first trimester can confirm pregnancy and show a heartbeat, depending on how far along you are. ? Your health care provider will check your baby's heart rate at every prenatal visit. ? As you get closer to your delivery date, you may have regular fetal heart rate monitoring to make sure that your baby is not in distress.  Second trimester ultrasound. ? This ultrasound checks your baby's development. It also indicates your baby's gender.  What should I do if I have concerns about my baby's development? Always talk with your health care provider about any concerns that you may have. This information is not intended to  replace advice given to you by your health care provider. Make sure you discuss any questions you have with your health care provider. Document Released: 05/03/2008 Document Revised: 04/22/2016 Document Reviewed: 04/24/2014 Elsevier Interactive Patient Education  [employer].

## 2018-08-22 ENCOUNTER — Ambulatory Visit (INDEPENDENT_AMBULATORY_CARE_PROVIDER_SITE_OTHER): Payer: Medicaid Other | Admitting: Certified Nurse Midwife

## 2018-08-22 VITALS — BP 115/71 | HR 99 | Wt 168.5 lb

## 2018-08-22 DIAGNOSIS — Z3493 Encounter for supervision of normal pregnancy, unspecified, third trimester: Secondary | ICD-10-CM | POA: Diagnosis not present

## 2018-08-22 LAB — POCT URINALYSIS DIPSTICK
BILIRUBIN UA: NEGATIVE
Glucose, UA: NEGATIVE
Ketones, UA: NEGATIVE
Leukocytes, UA: NEGATIVE
NITRITE UA: NEGATIVE
PH UA: 7 (ref 5.0–8.0)
PROTEIN UA: NEGATIVE
RBC UA: NEGATIVE
Spec Grav, UA: 1.01 (ref 1.010–1.025)
UROBILINOGEN UA: 0.2 U/dL

## 2018-08-22 NOTE — Patient Instructions (Signed)
Vaginal Delivery Vaginal delivery means that you will give birth by pushing your baby out of your birth canal (vagina). A team of health care providers will help you before, during, and after vaginal delivery. Birth experiences are unique for every woman and every pregnancy, and birth experiences vary depending on where you choose to give birth. What should I do to prepare for my baby's birth? Before your baby is born, it is important to talk with your health care provider about:  Your labor and delivery preferences. These may include: ? Medicines that you may be given. ? How you will manage your pain. This might include non-medical pain relief techniques or injectable pain relief such as epidural analgesia. ? How you and your baby will be monitored during labor and delivery. ? Who may be in the labor and delivery room with you. ? Your feelings about surgical delivery of your baby (cesarean delivery, or C-section) if this becomes necessary. ? Your feelings about receiving donated blood through an IV tube (blood transfusion) if this becomes necessary.  Whether you are able: ? To take pictures or videos of the birth. ? To eat during labor and delivery. ? To move around, walk, or change positions during labor and delivery.  What to expect after your baby is born, such as: ? Whether delayed umbilical cord clamping and cutting is offered. ? Who will care for your baby right after birth. ? Medicines or tests that may be recommended for your baby. ? Whether breastfeeding is supported in your hospital or birth center. ? How long you will be in the hospital or birth center.  How any medical conditions you have may affect your baby or your labor and delivery experience.  To prepare for your baby's birth, you should also:  Attend all of your health care visits before delivery (prenatal visits) as recommended by your health care provider. This is important.  Prepare your home for your baby's  arrival. Make sure that you have: ? Diapers. ? Baby clothing. ? Feeding equipment. ? Safe sleeping arrangements for you and your baby.  Install a car seat in your vehicle. Have your car seat checked by a certified car seat installer to make sure that it is installed safely.  Think about who will help you with your new baby at home for at least the first several weeks after delivery.  What can I expect when I arrive at the birth center or hospital? Once you are in labor and have been admitted into the hospital or birth center, your health care provider may:  Review your pregnancy history and any concerns you have.  Insert an IV tube into one of your veins. This is used to give you fluids and medicines.  Check your blood pressure, pulse, temperature, and heart rate (vital signs).  Check whether your bag of water (amniotic sac) has broken (ruptured).  Talk with you about your birth plan and discuss pain control options.  Monitoring Your health care provider may monitor your contractions (uterine monitoring) and your baby's heart rate (fetal monitoring). You may need to be monitored:  Often, but not continuously (intermittently).  All the time or for long periods at a time (continuously). Continuous monitoring may be needed if: ? You are taking certain medicines, such as medicine to relieve pain or make your contractions stronger. ? You have pregnancy or labor complications.  Monitoring may be done by:  Placing a special stethoscope or a handheld monitoring device on your abdomen to   check your baby's heartbeat, and feeling your abdomen for contractions. This method of monitoring does not continuously record your baby's heartbeat or your contractions.  Placing monitors on your abdomen (external monitors) to record your baby's heartbeat and the frequency and length of contractions. You may not have to wear external monitors all the time.  Placing monitors inside of your uterus  (internal monitors) to record your baby's heartbeat and the frequency, length, and strength of your contractions. ? Your health care provider may use internal monitors if he or she needs more information about the strength of your contractions or your baby's heart rate. ? Internal monitors are put in place by passing a thin, flexible wire through your vagina and into your uterus. Depending on the type of monitor, it may remain in your uterus or on your baby's head until birth. ? Your health care provider will discuss the benefits and risks of internal monitoring with you and will ask for your permission before inserting the monitors.  Telemetry. This is a type of continuous monitoring that can be done with external or internal monitors. Instead of having to stay in bed, you are able to move around during telemetry. Ask your health care provider if telemetry is an option for you.  Physical exam Your health care provider may perform a physical exam. This may include:  Checking whether your baby is positioned: ? With the head toward your vagina (head-down). This is most common. ? With the head toward the top of your uterus (head-up or breech). If your baby is in a breech position, your health care provider may try to turn your baby to a head-down position so you can deliver vaginally. If it does not seem that your baby can be born vaginally, your provider may recommend surgery to deliver your baby. In rare cases, you may be able to deliver vaginally if your baby is head-up (breech delivery). ? Lying sideways (transverse). Babies that are lying sideways cannot be delivered vaginally.  Checking your cervix to determine: ? Whether it is thinning out (effacing). ? Whether it is opening up (dilating). ? How low your baby has moved into your birth canal.  What are the three stages of labor and delivery?  Normal labor and delivery is divided into the following three stages: Stage 1  Stage 1 is the  longest stage of labor, and it can last for hours or days. Stage 1 includes: ? Early labor. This is when contractions may be irregular, or regular and mild. Generally, early labor contractions are more than 10 minutes apart. ? Active labor. This is when contractions get longer, more regular, more frequent, and more intense. ? The transition phase. This is when contractions happen very close together, are very intense, and may last longer than during any other part of labor.  Contractions generally feel mild, infrequent, and irregular at first. They get stronger, more frequent (about every 2-3 minutes), and more regular as you progress from early labor through active labor and transition.  Many women progress through stage 1 naturally, but you may need help to continue making progress. If this happens, your health care provider may talk with you about: ? Rupturing your amniotic sac if it has not ruptured yet. ? Giving you medicine to help make your contractions stronger and more frequent.  Stage 1 ends when your cervix is completely dilated to 4 inches (10 cm) and completely effaced. This happens at the end of the transition phase. Stage 2  Once   your cervix is completely effaced and dilated to 4 inches (10 cm), you may start to feel an urge to push. It is common for the body to naturally take a rest before feeling the urge to push, especially if you received an epidural or certain other pain medicines. This rest period may last for up to 1-2 hours, depending on your unique labor experience.  During stage 2, contractions are generally less painful, because pushing helps relieve contraction pain. Instead of contraction pain, you may feel stretching and burning pain, especially when the widest part of your baby's head passes through the vaginal opening (crowning).  Your health care provider will closely monitor your pushing progress and your baby's progress through the vagina during stage 2.  Your  health care provider may massage the area of skin between your vaginal opening and anus (perineum) or apply warm compresses to your perineum. This helps it stretch as the baby's head starts to crown, which can help prevent perineal tearing. ? In some cases, an incision may be made in your perineum (episiotomy) to allow the baby to pass through the vaginal opening. An episiotomy helps to make the opening of the vagina larger to allow more room for the baby to fit through.  It is very important to breathe and focus so your health care provider can control the delivery of your baby's head. Your health care provider may have you decrease the intensity of your pushing, to help prevent perineal tearing.  After delivery of your baby's head, the shoulders and the rest of the body generally deliver very quickly and without difficulty.  Once your baby is delivered, the umbilical cord may be cut right away, or this may be delayed for 1-2 minutes, depending on your baby's health. This may vary among health care providers, hospitals, and birth centers.  If you and your baby are healthy enough, your baby may be placed on your chest or abdomen to help maintain the baby's temperature and to help you bond with each other. Some mothers and babies start breastfeeding at this time. Your health care team will dry your baby and help keep your baby warm during this time.  Your baby may need immediate care if he or she: ? Showed signs of distress during labor. ? Has a medical condition. ? Was born too early (prematurely). ? Had a bowel movement before birth (meconium). ? Shows signs of difficulty transitioning from being inside the uterus to being outside of the uterus. If you are planning to breastfeed, your health care team will help you begin a feeding. Stage 3  The third stage of labor starts immediately after the birth of your baby and ends after you deliver the placenta. The placenta is an organ that develops  during pregnancy to provide oxygen and nutrients to your baby in the womb.  Delivering the placenta may require some pushing, and you may have mild contractions. Breastfeeding can stimulate contractions to help you deliver the placenta.  After the placenta is delivered, your uterus should tighten (contract) and become firm. This helps to stop bleeding in your uterus. To help your uterus contract and to control bleeding, your health care provider may: ? Give you medicine by injection, through an IV tube, by mouth, or through your rectum (rectally). ? Massage your abdomen or perform a vaginal exam to remove any blood clots that are left in your uterus. ? Empty your bladder by placing a thin, flexible tube (catheter) into your bladder. ? Encourage   you to breastfeed your baby. After labor is over, you and your baby will be monitored closely to ensure that you are both healthy until you are ready to go home. Your health care team will teach you how to care for yourself and your baby. This information is not intended to replace advice given to you by your health care provider. Make sure you discuss any questions you have with your health care provider. Document Released: 08/24/2008 Document Revised: 06/04/2016 Document Reviewed: 11/30/2015 Elsevier Interactive Patient Education  2018 [employer]  

## 2018-08-24 NOTE — Progress Notes (Signed)
ROB-Doing well, no questions or concerns. Discussed POC due to history of chronic kidney disease; agrees to proceed with IOL in 39th week. Reviewed red flag symptoms and when to call. RTC x 1 week for 36 week cultures and ROB or sooner if needed.

## 2018-08-29 ENCOUNTER — Ambulatory Visit (INDEPENDENT_AMBULATORY_CARE_PROVIDER_SITE_OTHER): Payer: Medicaid Other | Admitting: Certified Nurse Midwife

## 2018-08-29 ENCOUNTER — Encounter: Payer: Self-pay | Admitting: Certified Nurse Midwife

## 2018-08-29 VITALS — BP 108/67 | HR 80 | Wt 169.4 lb

## 2018-08-29 DIAGNOSIS — Z3493 Encounter for supervision of normal pregnancy, unspecified, third trimester: Secondary | ICD-10-CM | POA: Diagnosis not present

## 2018-08-29 LAB — POCT URINALYSIS DIPSTICK OB
Bilirubin, UA: NEGATIVE
Blood, UA: NEGATIVE
GLUCOSE, UA: NEGATIVE
KETONES UA: NEGATIVE
Leukocytes, UA: NEGATIVE
Nitrite, UA: NEGATIVE
POC,PROTEIN,UA: NEGATIVE
SPEC GRAV UA: 1.01 (ref 1.010–1.025)
UROBILINOGEN UA: 0.2 U/dL
pH, UA: 7.5 (ref 5.0–8.0)

## 2018-08-29 NOTE — Progress Notes (Signed)
ROB, doing well, no complaints. Feels good movement. GBS and cultures today. Will follow up with results. Return in 1 wks.  Doreene Burke, CNM

## 2018-08-29 NOTE — Patient Instructions (Signed)
Group B Streptococcus Infection During Pregnancy Group B Streptococcus (GBS) is a type of bacteria (Streptococcus agalactiae) that is often found in healthy people, commonly in the rectum, vagina, and intestines. In people who are healthy and not pregnant, the bacteria rarely cause serious illness or complications. However, women who test positive for GBS during pregnancy can pass the bacteria to their baby during childbirth, which can cause serious infection in the baby after birth. Women with GBS may also have infections during their pregnancy or immediately after childbirth, such as such as urinary tract infections (UTIs) or infections of the uterus (uterine infections). Having GBS also increases a woman's risk of complications during pregnancy, such as early (preterm) labor or delivery, miscarriage, or stillbirth. Routine testing (screening) for GBS is recommended for all pregnant women. What increases the risk? You may have a higher risk for GBS infection during pregnancy if you had one during a past pregnancy. What are the signs or symptoms? In most cases, GBS infection does not cause symptoms in pregnant women. Signs and symptoms of a possible GBS-related infection may include:  Labor starting before the 37th week of pregnancy.  A UTI or bladder infection, which may cause: ? Fever. ? Pain or burning during urination. ? Frequent urination.  Fever during labor, along with: ? Bad-smelling discharge. ? Uterine tenderness. ? Rapid heartbeat in the mother, baby, or both.  Rare but serious symptoms of a possible GBS-related infection in women include:  Blood infection (septicemia). This may cause fever, chills, or confusion.  Lung infection (pneumonia). This may cause fever, chills, cough, rapid breathing, difficulty breathing, or chest pain.  Bone, joint, skin, or soft tissue infection.  How is this diagnosed? You may be screened for GBS between week 35 and week 37 of your pregnancy. If  you have symptoms of preterm labor, you may be screened earlier. This condition is diagnosed based on lab test results from:  A swab of fluid from the vagina and rectum.  A urine sample.  How is this treated? This condition is treated with antibiotic medicine. When you go into labor, or as soon as your water breaks (your membranes rupture), you will be given antibiotics through an IV tube. Antibiotics will continue until after you give birth. If you are having a cesarean delivery, you do not need antibiotics unless your membranes have already ruptured. Follow these instructions at home:  Take over-the-counter and prescription medicines only as told by your health care provider.  Take your antibiotic medicine as told by your health care provider. Do not stop taking the antibiotic even if you start to feel better.  Keep all pre-birth (prenatal) visits and follow-up visits as told by your health care provider. This is important. Contact a health care provider if:  You have pain or burning when you urinate.  You have to urinate frequently.  You have a fever or chills.  You develop a bad-smelling vaginal discharge. Get help right away if:  Your membranes rupture.  You go into labor.  You have severe pain in your abdomen.  You have difficulty breathing.  You have chest pain. This information is not intended to replace advice given to you by your health care provider. Make sure you discuss any questions you have with your health care provider. Document Released: 02/22/2008 Document Revised: 06/11/2016 Document Reviewed: 06/10/2016 Elsevier Interactive Patient Education  2018 [employer]  

## 2018-08-31 LAB — STREP GP B NAA: Strep Gp B NAA: NEGATIVE

## 2018-09-01 LAB — GC/CHLAMYDIA PROBE AMP
Chlamydia trachomatis, NAA: NEGATIVE
Neisseria gonorrhoeae by PCR: NEGATIVE

## 2018-09-06 ENCOUNTER — Ambulatory Visit (INDEPENDENT_AMBULATORY_CARE_PROVIDER_SITE_OTHER): Payer: Medicaid Other | Admitting: Obstetrics and Gynecology

## 2018-09-06 VITALS — BP 112/74 | HR 88 | Wt 170.0 lb

## 2018-09-06 DIAGNOSIS — Z3493 Encounter for supervision of normal pregnancy, unspecified, third trimester: Secondary | ICD-10-CM

## 2018-09-06 LAB — POCT URINALYSIS DIPSTICK OB
Bilirubin, UA: NEGATIVE
Glucose, UA: NEGATIVE
Ketones, UA: NEGATIVE
Leukocytes, UA: NEGATIVE
NITRITE UA: NEGATIVE
PH UA: 6.5 (ref 5.0–8.0)
PROTEIN: NEGATIVE
RBC UA: NEGATIVE
Spec Grav, UA: 1.01 (ref 1.010–1.025)
UROBILINOGEN UA: 0.2 U/dL

## 2018-09-06 NOTE — Progress Notes (Signed)
ROB- doing well labor precautions given. Will plan IOL 10/24 if not delivered by then

## 2018-09-06 NOTE — Progress Notes (Signed)
ROB- pt is having a lot of pelvic pressure 

## 2018-09-13 ENCOUNTER — Ambulatory Visit (INDEPENDENT_AMBULATORY_CARE_PROVIDER_SITE_OTHER): Payer: Medicaid Other | Admitting: Certified Nurse Midwife

## 2018-09-13 VITALS — BP 105/64 | HR 94 | Wt 173.1 lb

## 2018-09-13 DIAGNOSIS — Z3493 Encounter for supervision of normal pregnancy, unspecified, third trimester: Secondary | ICD-10-CM

## 2018-09-13 NOTE — Patient Instructions (Signed)
Braxton Hicks Contractions °Contractions of the uterus can occur throughout pregnancy, but they are not always a sign that you are in labor. You may have practice contractions called Braxton Hicks contractions. These false labor contractions are sometimes confused with true labor. °What are Braxton Hicks contractions? °Braxton Hicks contractions are tightening movements that occur in the muscles of the uterus before labor. Unlike true labor contractions, these contractions do not result in opening (dilation) and thinning of the cervix. Toward the end of pregnancy (32-34 weeks), Braxton Hicks contractions can happen more often and may become stronger. These contractions are sometimes difficult to tell apart from true labor because they can be very uncomfortable. You should not feel embarrassed if you go to the hospital with false labor. °Sometimes, the only way to tell if you are in true labor is for your health care provider to look for changes in the cervix. The health care provider will do a physical exam and may monitor your contractions. If you are not in true labor, the exam should show that your cervix is not dilating and your water has not broken. °If there are other health problems associated with your pregnancy, it is completely safe for you to be sent home with false labor. You may continue to have Braxton Hicks contractions until you go into true labor. °How to tell the difference between true labor and false labor °True labor °· Contractions last 30-70 seconds. °· Contractions become very regular. °· Discomfort is usually felt in the top of the uterus, and it spreads to the lower abdomen and low back. °· Contractions do not go away with walking. °· Contractions usually become more intense and increase in frequency. °· The cervix dilates and gets thinner. °False labor °· Contractions are usually shorter and not as strong as true labor contractions. °· Contractions are usually irregular. °· Contractions  are often felt in the front of the lower abdomen and in the groin. °· Contractions may go away when you walk around or change positions while lying down. °· Contractions get weaker and are shorter-lasting as time goes on. °· The cervix usually does not dilate or become thin. °Follow these instructions at home: °· Take over-the-counter and prescription medicines only as told by your health care provider. °· Keep up with your usual exercises and follow other instructions from your health care provider. °· Eat and drink lightly if you think you are going into labor. °· If Braxton Hicks contractions are making you uncomfortable: °? Change your position from lying down or resting to walking, or change from walking to resting. °? Sit and rest in a tub of warm water. °? Drink enough fluid to keep your urine pale yellow. Dehydration may cause these contractions. °? Do slow and deep breathing several times an hour. °· Keep all follow-up prenatal visits as told by your health care provider. This is important. °Contact a health care provider if: °· You have a fever. °· You have continuous pain in your abdomen. °Get help right away if: °· Your contractions become stronger, more regular, and closer together. °· You have fluid leaking or gushing from your vagina. °· You pass blood-tinged mucus (bloody show). °· You have bleeding from your vagina. °· You have low back pain that you never had before. °· You feel your baby’s head pushing down and causing pelvic pressure. °· Your baby is not moving inside you as much as it used to. °Summary °· Contractions that occur before labor are called Braxton   Hicks contractions, false labor, or practice contractions. °· Braxton Hicks contractions are usually shorter, weaker, farther apart, and less regular than true labor contractions. True labor contractions usually become progressively stronger and regular and they become more frequent. °· Manage discomfort from Braxton Hicks contractions by  changing position, resting in a warm bath, drinking plenty of water, or practicing deep breathing. °This information is not intended to replace advice given to you by your health care provider. Make sure you discuss any questions you have with your health care provider. °Document Released: 03/31/2017 Document Revised: 03/31/2017 Document Reviewed: 03/31/2017 °Elsevier Interactive Patient Education © 2018 [employer] ° °

## 2018-09-13 NOTE — Progress Notes (Signed)
ROB doing well. No complaints . Feels good movement. Discussed induction.labor precautions reviewed.  Return to [facility] as scheduled.   Doreene Burke, CNM

## 2018-09-20 ENCOUNTER — Other Ambulatory Visit: Payer: Self-pay

## 2018-09-20 ENCOUNTER — Other Ambulatory Visit: Payer: Self-pay | Admitting: Certified Nurse Midwife

## 2018-09-20 ENCOUNTER — Encounter: Payer: Self-pay | Admitting: Obstetrics and Gynecology

## 2018-09-20 ENCOUNTER — Inpatient Hospital Stay
Admission: EM | Admit: 2018-09-20 | Discharge: 2018-09-23 | DRG: 798 | Disposition: A | Discharge status: Home / Self Care | Payer: Medicaid Other | Attending: Certified Nurse Midwife | Admitting: Certified Nurse Midwife

## 2018-09-20 DIAGNOSIS — O4202 Full-term premature rupture of membranes, onset of labor within 24 hours of rupture: Secondary | ICD-10-CM | POA: Diagnosis not present

## 2018-09-20 DIAGNOSIS — Z3A39 39 weeks gestation of pregnancy: Secondary | ICD-10-CM

## 2018-09-20 DIAGNOSIS — Z7982 Long term (current) use of aspirin: Secondary | ICD-10-CM | POA: Diagnosis not present

## 2018-09-20 DIAGNOSIS — Z3483 Encounter for supervision of other normal pregnancy, third trimester: Secondary | ICD-10-CM | POA: Diagnosis present

## 2018-09-20 DIAGNOSIS — Z302 Encounter for sterilization: Secondary | ICD-10-CM | POA: Diagnosis not present

## 2018-09-20 LAB — CBC
HCT: 31.6 % — ABNORMAL LOW (ref 36.0–46.0)
HEMOGLOBIN: 10.2 g/dL — AB (ref 12.0–15.0)
MCH: 27.9 pg (ref 26.0–34.0)
MCHC: 32.3 g/dL (ref 30.0–36.0)
MCV: 86.6 fL (ref 80.0–100.0)
Platelets: 164 10*3/uL (ref 150–400)
RBC: 3.65 MIL/uL — ABNORMAL LOW (ref 3.87–5.11)
RDW: 13.8 % (ref 11.5–15.5)
WBC: 10.6 10*3/uL — AB (ref 4.0–10.5)
nRBC: 0 % (ref 0.0–0.2)

## 2018-09-20 LAB — COMPREHENSIVE METABOLIC PANEL
ALBUMIN: 3.1 g/dL — AB (ref 3.5–5.0)
ALK PHOS: 171 U/L — AB (ref 38–126)
ALT: 12 U/L (ref 0–44)
ANION GAP: 10 (ref 5–15)
AST: 23 U/L (ref 15–41)
BILIRUBIN TOTAL: 0.6 mg/dL (ref 0.3–1.2)
BUN: 9 mg/dL (ref 6–20)
CALCIUM: 9.2 mg/dL (ref 8.9–10.3)
CO2: 19 mmol/L — AB (ref 22–32)
Chloride: 109 mmol/L (ref 98–111)
Creatinine, Ser: 0.76 mg/dL (ref 0.44–1.00)
GFR calc non Af Amer: >60 mL/min (ref >60)
Glucose, Bld: 102 mg/dL — ABNORMAL HIGH (ref 70–99)
POTASSIUM: 3.7 mmol/L (ref 3.5–5.1)
SODIUM: 138 mmol/L (ref 135–145)
TOTAL PROTEIN: 6.3 g/dL — AB (ref 6.5–8.1)

## 2018-09-20 LAB — TYPE AND SCREEN
ABO/RH(D): O POS
Antibody Screen: NEGATIVE

## 2018-09-20 MED ORDER — LACTATED RINGERS IV SOLN: 500.0000 mL | INTRAVENOUS | Status: DC | PRN

## 2018-09-20 MED ORDER — BUTORPHANOL TARTRATE 1 MG/ML IJ SOLN
1.0000 mg | INTRAMUSCULAR | Status: DC | PRN
  Administered 2018-09-21: 1 mg via INTRAVENOUS
  Filled 2018-09-20: qty 1

## 2018-09-20 MED ORDER — OXYTOCIN 10 UNIT/ML IJ SOLN: 10.0000 [IU] | Freq: Once | INTRAMUSCULAR | Status: DC

## 2018-09-20 MED ORDER — ACETAMINOPHEN 325 MG PO TABS
650.0000 mg | ORAL_TABLET | ORAL | Status: DC | PRN
  Administered 2018-09-21: 650 mg via ORAL
  Filled 2018-09-20: qty 2

## 2018-09-20 MED ORDER — LIDOCAINE HCL (PF) 1 % IJ SOLN: 30.0000 mL | INTRAMUSCULAR | Status: DC | PRN

## 2018-09-20 MED ORDER — MISOPROSTOL 25 MCG QUARTER TABLET: 50.0000 ug | ORAL_TABLET | ORAL | Status: DC | PRN

## 2018-09-20 MED ORDER — OXYTOCIN BOLUS FROM INFUSION
500.0000 mL | Freq: Once | INTRAVENOUS | Status: AC
  Administered 2018-09-21: 500 mL via INTRAVENOUS

## 2018-09-20 MED ORDER — AMMONIA AROMATIC IN INHA
RESPIRATORY_TRACT | Status: AC
  Filled 2018-09-20: qty 10

## 2018-09-20 MED ORDER — MISOPROSTOL 200 MCG PO TABS
ORAL_TABLET | ORAL | Status: AC
  Filled 2018-09-20: qty 4

## 2018-09-20 MED ORDER — LIDOCAINE HCL (PF) 1 % IJ SOLN
INTRAMUSCULAR | Status: AC
  Filled 2018-09-20: qty 30

## 2018-09-20 MED ORDER — OXYTOCIN 40 UNITS IN LACTATED RINGERS INFUSION - SIMPLE MED
2.5000 [IU]/h | INTRAVENOUS | Status: DC
  Administered 2018-09-21 (×2): 2.5 [IU]/h via INTRAVENOUS

## 2018-09-20 MED ORDER — LACTATED RINGERS IV SOLN
INTRAVENOUS | Status: DC
  Administered 2018-09-20 – 2018-09-21 (×2): via INTRAVENOUS

## 2018-09-20 MED ORDER — ONDANSETRON HCL 4 MG/2ML IJ SOLN: 4.0000 mg | Freq: Four times a day (QID) | INTRAMUSCULAR | Status: DC | PRN

## 2018-09-20 MED ORDER — OXYTOCIN 10 UNIT/ML IJ SOLN
INTRAMUSCULAR | Status: AC
  Filled 2018-09-20: qty 2

## 2018-09-20 MED ORDER — SOD CITRATE-CITRIC ACID 500-334 MG/5ML PO SOLN: 30.0000 mL | ORAL | Status: DC | PRN

## 2018-09-20 MED ORDER — TERBUTALINE SULFATE 1 MG/ML IJ SOLN: 0.2500 mg | Freq: Once | INTRAMUSCULAR | Status: DC | PRN

## 2018-09-20 NOTE — OB Triage Note (Signed)
Pt presents with c/o LOF, clear, which began with a large gush at 9:30pm. Pt reports + fetal movement. Denies vaginal bleeding and contractions. Pt previously scheduled for IOL at 0001 on 10/24. Toco and EFM applied and explained. Will monitor.

## 2018-09-20 NOTE — Plan of Care (Signed)
Reviewed plan of care with pt. All questions answered. Will continue to monitor closely. 

## 2018-09-21 ENCOUNTER — Inpatient Hospital Stay: Payer: Medicaid Other | Admitting: Anesthesiology

## 2018-09-21 DIAGNOSIS — Z3A39 39 weeks gestation of pregnancy: Secondary | ICD-10-CM

## 2018-09-21 DIAGNOSIS — O4202 Full-term premature rupture of membranes, onset of labor within 24 hours of rupture: Secondary | ICD-10-CM

## 2018-09-21 LAB — PROTEIN / CREATININE RATIO, URINE
Creatinine, Urine: 175 mg/dL
Protein Creatinine Ratio: 0.13 mg/mg{Cre} (ref 0.00–0.15)
Total Protein, Urine: 23 mg/dL

## 2018-09-21 MED ORDER — OXYCODONE-ACETAMINOPHEN 5-325 MG PO TABS
2.0000 | ORAL_TABLET | ORAL | Status: DC | PRN
  Administered 2018-09-22 – 2018-09-23 (×2): 2 via ORAL
  Filled 2018-09-21 (×2): qty 2

## 2018-09-21 MED ORDER — DIBUCAINE 1 % RE OINT: 1.0000 "application " | TOPICAL_OINTMENT | RECTAL | Status: DC | PRN

## 2018-09-21 MED ORDER — PHENYLEPHRINE 40 MCG/ML (10ML) SYRINGE FOR IV PUSH (FOR BLOOD PRESSURE SUPPORT)
80.0000 ug | PREFILLED_SYRINGE | INTRAVENOUS | Status: DC | PRN
  Filled 2018-09-21: qty 5

## 2018-09-21 MED ORDER — BENZOCAINE-MENTHOL 20-0.5 % EX AERO
1.0000 "application " | INHALATION_SPRAY | CUTANEOUS | Status: DC | PRN
  Administered 2018-09-22: 1 via TOPICAL
  Filled 2018-09-21: qty 56

## 2018-09-21 MED ORDER — FENTANYL 2.5 MCG/ML W/ROPIVACAINE 0.15% IN NS 100 ML EPIDURAL (ARMC): 12.0000 mL/h | EPIDURAL | Status: DC

## 2018-09-21 MED ORDER — OXYTOCIN 40 UNITS IN LACTATED RINGERS INFUSION - SIMPLE MED
1.0000 m[IU]/min | INTRAVENOUS | Status: DC
  Administered 2018-09-21: 2 m[IU]/min via INTRAVENOUS
  Filled 2018-09-21 (×2): qty 1000

## 2018-09-21 MED ORDER — LIDOCAINE HCL (PF) 1 % IJ SOLN
INTRAMUSCULAR | Status: DC | PRN
  Administered 2018-09-21: 3 mL via INTRADERMAL

## 2018-09-21 MED ORDER — WITCH HAZEL-GLYCERIN EX PADS: 1.0000 "application " | MEDICATED_PAD | CUTANEOUS | Status: DC | PRN

## 2018-09-21 MED ORDER — EPHEDRINE 5 MG/ML INJ
10.0000 mg | INTRAVENOUS | Status: DC | PRN
  Filled 2018-09-21: qty 2

## 2018-09-21 MED ORDER — LIDOCAINE-EPINEPHRINE (PF) 1.5 %-1:200000 IJ SOLN
INTRAMUSCULAR | Status: DC | PRN
  Administered 2018-09-21: 4 mL via EPIDURAL

## 2018-09-21 MED ORDER — SENNOSIDES-DOCUSATE SODIUM 8.6-50 MG PO TABS: 2.0000 | ORAL_TABLET | ORAL | Status: DC

## 2018-09-21 MED ORDER — COCONUT OIL OIL: 1.0000 "application " | TOPICAL_OIL | Status: DC | PRN

## 2018-09-21 MED ORDER — IBUPROFEN 600 MG PO TABS
600.0000 mg | ORAL_TABLET | Freq: Four times a day (QID) | ORAL | Status: DC
  Administered 2018-09-21 – 2018-09-23 (×5): 600 mg via ORAL
  Filled 2018-09-21 (×5): qty 1

## 2018-09-21 MED ORDER — OXYCODONE-ACETAMINOPHEN 5-325 MG PO TABS: 1.0000 | ORAL_TABLET | ORAL | Status: DC | PRN

## 2018-09-21 MED ORDER — TERBUTALINE SULFATE 1 MG/ML IJ SOLN: 0.2500 mg | Freq: Once | INTRAMUSCULAR | Status: DC | PRN

## 2018-09-21 MED ORDER — METHYLERGONOVINE MALEATE 0.2 MG PO TABS: 0.2000 mg | ORAL_TABLET | ORAL | Status: DC | PRN

## 2018-09-21 MED ORDER — DOCUSATE SODIUM 100 MG PO CAPS
100.0000 mg | ORAL_CAPSULE | Freq: Two times a day (BID) | ORAL | Status: DC
  Administered 2018-09-22: 100 mg via ORAL
  Filled 2018-09-21: qty 1

## 2018-09-21 MED ORDER — FENTANYL 2.5 MCG/ML W/ROPIVACAINE 0.15% IN NS 100 ML EPIDURAL (ARMC)
EPIDURAL | Status: AC
  Filled 2018-09-21: qty 100

## 2018-09-21 MED ORDER — DIPHENHYDRAMINE HCL 50 MG/ML IJ SOLN: 12.5000 mg | INTRAMUSCULAR | Status: DC | PRN

## 2018-09-21 MED ORDER — SIMETHICONE 80 MG PO CHEW: 80.0000 mg | CHEWABLE_TABLET | ORAL | Status: DC | PRN

## 2018-09-21 MED ORDER — ONDANSETRON HCL 4 MG PO TABS: 4.0000 mg | ORAL_TABLET | ORAL | Status: DC | PRN

## 2018-09-21 MED ORDER — LACTATED RINGERS IV SOLN: 500.0000 mL | Freq: Once | INTRAVENOUS | Status: DC

## 2018-09-21 MED ORDER — ACETAMINOPHEN 325 MG PO TABS: 650.0000 mg | ORAL_TABLET | ORAL | Status: DC | PRN

## 2018-09-21 MED ORDER — METHYLERGONOVINE MALEATE 0.2 MG/ML IJ SOLN: 0.2000 mg | INTRAMUSCULAR | Status: DC | PRN

## 2018-09-21 MED ORDER — BUPIVACAINE HCL (PF) 0.25 % IJ SOLN
INTRAMUSCULAR | Status: DC | PRN
  Administered 2018-09-21: 3 mL via EPIDURAL
  Administered 2018-09-21: 5 mL via EPIDURAL

## 2018-09-21 MED ORDER — FENTANYL 2.5 MCG/ML W/ROPIVACAINE 0.15% IN NS 100 ML EPIDURAL (ARMC)
EPIDURAL | Status: DC | PRN
  Administered 2018-09-21: 12 mL/h via EPIDURAL

## 2018-09-21 MED ORDER — PRENATAL MULTIVITAMIN CH: 1.0000 | ORAL_TABLET | Freq: Every day | ORAL | Status: DC

## 2018-09-21 MED ORDER — ONDANSETRON HCL 4 MG/2ML IJ SOLN: 4.0000 mg | INTRAMUSCULAR | Status: DC | PRN

## 2018-09-21 MED ORDER — FERROUS SULFATE 325 (65 FE) MG PO TABS
325.0000 mg | ORAL_TABLET | Freq: Every day | ORAL | Status: DC
  Administered 2018-09-22: 325 mg via ORAL
  Filled 2018-09-21: qty 1

## 2018-09-21 NOTE — Anesthesia Preprocedure Evaluation (Signed)
Anesthesia Evaluation  Patient identified by MRN, date of birth, ID band Patient awake    Reviewed: Allergy & Precautions, H&P , NPO status , Patient's Chart, lab work & pertinent test results, reviewed documented beta blocker date and time   Airway Mallampati: II  TM Distance: >3 FB Neck ROM: full    Dental no notable dental hx. (+) Teeth Intact   Pulmonary neg pulmonary ROS, Current Smoker,    Pulmonary exam normal breath sounds clear to auscultation       Cardiovascular Exercise Tolerance: Good negative cardio ROS   Rhythm:regular Rate:Normal     Neuro/Psych negative neurological ROS  negative psych ROS   GI/Hepatic negative GI ROS, Neg liver ROS,   Endo/Other  negative endocrine ROSdiabetes  Renal/GU Renal disease     Musculoskeletal   Abdominal   Peds  Hematology negative hematology ROS (+)   Anesthesia Other Findings   Reproductive/Obstetrics (+) Pregnancy                             Anesthesia Physical Anesthesia Plan  ASA: II  Anesthesia Plan: Epidural   Post-op Pain Management:    Induction:   PONV Risk Score and Plan:   Airway Management Planned:   Additional Equipment:   Intra-op Plan:   Post-operative Plan:   Informed Consent: I have reviewed the patients History and Physical, chart, labs and discussed the procedure including the risks, benefits and alternatives for the proposed anesthesia with the patient or authorized representative who has indicated his/her understanding and acceptance.     Plan Discussed with:   Anesthesia Plan Comments:         Anesthesia Quick Evaluation

## 2018-09-21 NOTE — H&P (Signed)
History and Physical   HPI  Stephanie Marquez is a 32 y.o. [id number] at [redacted]w[redacted]d Estimated Date of Delivery: 09/25/18 who is being admitted for SROM, induction of labor   OB History  OB History  Gravida Para Term Preterm AB Living  7 2 2  0 4 2  SAB TAB Ectopic Multiple Live Births  3 0 1 0 2    # Outcome Date GA Lbr Len/2nd Weight Sex Delivery Anes PTL Lv  7 Current           6 SAB 12/27/16          5 SAB 2016          4 Term 11/30/12    Judie Petit Vag-Spont EPI  LIV  3 SAB 2014          2 Ectopic 2013          1 Term 08/13/10    M Vag-Spont EPI  LIV    PROBLEM LIST  Pregnancy complications or risks: Patient Active Problem List   Diagnosis Date Noted  . Labor and delivery, indication for care 09/20/2018  . Pregnancy with history of ectopic pregnancy, antepartum 01/31/2018    Prenatal labs and studies: ABO, Rh: --/--/O POS (10/23 2232) Antibody: NEG (10/23 2232) Rubella: 1.41 (03/25 1608) RPR: Non Reactive (03/25 1608)  HBsAg: Negative (03/25 1608)  HIV: Non Reactive (03/25 1608)  JWJ:XBJYNWGN (10/01 1640)   Past Medical History:  Diagnosis Date  . Chronic kidney disease   . Kidney stones      Past Surgical History:  Procedure Laterality Date  . DILATION AND EVACUATION N/A 12/26/2016   Procedure: DILATATION AND EVACUATION;  Surgeon: Linzie Collin, MD;  Location: [facility] ORS;  Service: Gynecology;  Laterality: N/A;  . ECTOPIC PREGNANCY SURGERY       Medications    Current Discharge Medication List    CONTINUE these medications which have NOT CHANGED   Details  aspirin EC 81 MG tablet Take 1 tablet (81 mg total) by mouth daily. Take after 12 weeks for prevention of preeclampssia later in pregnancy Qty: 300 tablet, Refills: 2    Prenatal Vit-Fe Fumarate-FA (PRENATAL MULTIVITAMIN) TABS tablet Take 1 tablet by mouth daily at 12 noon.         Allergies  Bactrim [sulfamethoxazole-trimethoprim]  Review of Systems  Constitutional: negative Eyes: negative Ears,  nose, mouth, throat, and face: negative Respiratory: negative Cardiovascular: negative Gastrointestinal: negative Genitourinary:negative Integument/breast: negative Hematologic/lymphatic: negative Musculoskeletal:negative Neurological: negative Behavioral/Psych: negative Endocrine: negative Allergic/Immunologic: negative  Physical Exam  BP (!) 94/51 (BP Location: Right Arm)   Pulse (!) 108   Temp 98.1 F (36.7 C) (Oral)   Resp 19   Ht 5\' 1"  (1.549 m)   Wt 78.5 kg   LMP 12/19/2017   SpO2 99%   BMI 32.70 kg/m   Lungs:  CTA  Cardio: RRR  Abd: Soft, gravid, NT Presentation: cephalic EXT: No C/C negative edema  CERVIX: Dilation: 4.5 Effacement (%): 80 Cervical Position: Middle Station: -1 Presentation: Vertex Exam by:: Onalee Hua, RN   See Prenatal records for more detailed PE.     FHR:  Baseline: 115 bpm, Variability: Good {> 6 bpm), Accelerations: Reactive and Decelerations: Absent  Toco: Uterine Contractions: Frequency: Every 3-5 minutes and Duration: 60-90 seconds  Test Results  Results for orders placed or performed during the hospital encounter of 09/20/18 (from the past 24 hour(s))  CBC     Status: Abnormal   Collection Time: 09/20/18 10:32 PM  Result Value Ref Range   WBC 10.6 (H) 4.0 - 10.5 K/uL   RBC 3.65 (L) 3.87 - 5.11 MIL/uL   Hemoglobin 10.2 (L) 12.0 - 15.0 g/dL   HCT 16.1 (L) 09.6 - 04.5 %   MCV 86.6 80.0 - 100.0 fL   MCH 27.9 26.0 - 34.0 pg   MCHC 32.3 30.0 - 36.0 g/dL   RDW 40.9 81.1 - 91.4 %   Platelets 164 150 - 400 K/uL   nRBC 0.0 0.0 - 0.2 %  Comprehensive metabolic panel     Status: Abnormal   Collection Time: 09/20/18 10:32 PM  Result Value Ref Range   Sodium 138 135 - 145 mmol/L   Potassium 3.7 3.5 - 5.1 mmol/L   Chloride 109 98 - 111 mmol/L   CO2 19 (L) 22 - 32 mmol/L   Glucose, Bld 102 (H) 70 - 99 mg/dL   BUN 9 6 - 20 mg/dL   Creatinine, Ser 7.82 0.44 - 1.00 mg/dL   Calcium 9.2 8.9 - 95.6 mg/dL   Total Protein 6.3 (L)  6.5 - 8.1 g/dL   Albumin 3.1 (L) 3.5 - 5.0 g/dL   AST 23 15 - 41 U/L   ALT 12 0 - 44 U/L   Alkaline Phosphatase 171 (H) 38 - 126 U/L   Total Bilirubin 0.6 0.3 - 1.2 mg/dL   GFR calc non Af Amer >60 >60 mL/min   GFR calc Af Amer >60 >60 mL/min   Anion gap 10 5 - 15  Type and screen     Status: None   Collection Time: 09/20/18 10:32 PM  Result Value Ref Range   ABO/RH(D) O POS    Antibody Screen NEG    Sample Expiration      09/23/2018 Performed at [facility], [address] [place], [place] [address]   Protein / creatinine ratio, urine     Status: None   Collection Time: 09/21/18  1:15 AM  Result Value Ref Range   Creatinine, Urine 175 mg/dL   Total Protein, Urine 23 mg/dL   Protein Creatinine Ratio 0.13 0.00 - 0.15 mg/mg[Cre]   Group B Strep negative  Assessment   [id number] at [redacted]w[redacted]d Estimated Date of Delivery: 09/25/18  The fetus is reassuring.   Patient Active Problem List   Diagnosis Date Noted  . Labor and delivery, indication for care 09/20/2018  . Pregnancy with history of ectopic pregnancy, antepartum 01/31/2018    Plan  1. Admit to L&D :   continue present management and IV Pitocin induction 2. EFM:-- Category 1 3. Epidural placed 4. Admission labs completed 5. Anticipate NSVD  Doreene Burke, CNM  09/21/2018 6:34 AM

## 2018-09-21 NOTE — Anesthesia Procedure Notes (Signed)
Epidural Patient location during procedure: OB  Staffing Performed: anesthesiologist   Preanesthetic Checklist Completed: patient identified, site marked, surgical consent, pre-op evaluation, timeout performed, IV checked, risks and benefits discussed and monitors and equipment checked  Epidural Patient position: sitting Prep: Betadine Patient monitoring: heart rate, continuous pulse ox and blood pressure Approach: midline Location: L4-L5 Injection technique: LOR saline  Needle:  Needle type: Tuohy  Needle gauge: 17 G Needle length: 9 cm and 9 Needle insertion depth: 6 cm Catheter type: closed end flexible Catheter size: 19 Gauge Catheter at skin depth: 11 cm Test dose: negative and 1.5% lidocaine with Epi 1:200 K  Assessment Sensory level: T10 Events: blood not aspirated, injection not painful, no injection resistance, negative IV test and no paresthesia  Additional Notes   Patient tolerated the insertion well without complications.-SATD -IVTD. No paresthesia. Refer to [facility] nursing for VS and dosingReason for block:procedure for pain     

## 2018-09-21 NOTE — Progress Notes (Signed)
Pt desires perm sterilization. Tubal We have discussed permanent sterilization in detail.  I have reviewed Filshie clip placement as a means of sterilization.  I have explained the risks and benefits of this procedure in detail.  I have stressed the fact that this is a permanent and not reversible procedure and there is a failure rate of 3 to7 per 1000 which is somewhat timing and method dependent.  I have discussed the possibility of inadvertent damage to bowel, bladder, blood vessels or other internal organs..  I have specifically discussed the risk of anesthesia, infection and blood loss with her.  We have discussed the possibility of laparotomy should there be a problem and the additional possibility of a longer hospital stay.  I have spoken with her regarding the recovery period, informing her that after this procedure, most people are performing their normal daily activities within one week.  I have recommended abstinence or another birth control method for 2-4 weeks following this procedure.  Other options of birth control have also been discussed including IUD.  Interval versus PP tubal was also discussed.  The R/B of each reviewed.  I have answered all her questions and I believe that she has an adequate and informed understanding of the procedure.

## 2018-09-22 ENCOUNTER — Inpatient Hospital Stay: Payer: Medicaid Other | Admitting: Anesthesiology

## 2018-09-22 ENCOUNTER — Encounter
Admission: EM | Disposition: A | Discharge status: Home / Self Care | Payer: Self-pay | Source: Home / Self Care | Attending: Certified Nurse Midwife

## 2018-09-22 ENCOUNTER — Encounter: Payer: Self-pay | Admitting: *Deleted

## 2018-09-22 DIAGNOSIS — Z302 Encounter for sterilization: Secondary | ICD-10-CM

## 2018-09-22 HISTORY — PX: TUBAL LIGATION: SHX77

## 2018-09-22 LAB — CBC
HEMATOCRIT: 28.4 % — AB (ref 36.0–46.0)
Hemoglobin: 9 g/dL — ABNORMAL LOW (ref 12.0–15.0)
MCH: 27.9 pg (ref 26.0–34.0)
MCHC: 31.7 g/dL (ref 30.0–36.0)
MCV: 87.9 fL (ref 80.0–100.0)
NRBC: 0 % (ref 0.0–0.2)
PLATELETS: 121 10*3/uL — AB (ref 150–400)
RBC: 3.23 MIL/uL — ABNORMAL LOW (ref 3.87–5.11)
RDW: 13.9 % (ref 11.5–15.5)
WBC: 10.1 10*3/uL (ref 4.0–10.5)

## 2018-09-22 LAB — RPR: RPR Ser Ql: NONREACTIVE

## 2018-09-22 SURGERY — LIGATION, FALLOPIAN TUBE, POSTPARTUM
Anesthesia: Spinal

## 2018-09-22 MED ORDER — MIDAZOLAM HCL 2 MG/2ML IJ SOLN
INTRAMUSCULAR | Status: AC
  Filled 2018-09-22: qty 2

## 2018-09-22 MED ORDER — DEXTROSE IN LACTATED RINGERS 5 % IV SOLN: INTRAVENOUS | Status: DC

## 2018-09-22 MED ORDER — FENTANYL CITRATE (PF) 100 MCG/2ML IJ SOLN
INTRAMUSCULAR | Status: AC
  Filled 2018-09-22: qty 2

## 2018-09-22 MED ORDER — BUPIVACAINE IN DEXTROSE 0.75-8.25 % IT SOLN
INTRATHECAL | Status: DC | PRN
  Administered 2018-09-22: 2 mL via INTRATHECAL

## 2018-09-22 MED ORDER — OXYCODONE HCL 5 MG/5ML PO SOLN: 5.0000 mg | Freq: Once | ORAL | Status: DC | PRN

## 2018-09-22 MED ORDER — MEPERIDINE HCL 50 MG/ML IJ SOLN: 6.2500 mg | INTRAMUSCULAR | Status: DC | PRN

## 2018-09-22 MED ORDER — LACTATED RINGERS IV SOLN
INTRAVENOUS | Status: DC
  Administered 2018-09-22 (×2): via INTRAVENOUS

## 2018-09-22 MED ORDER — MIDAZOLAM HCL 5 MG/5ML IJ SOLN
INTRAMUSCULAR | Status: DC | PRN
  Administered 2018-09-22 (×2): .5 mg via INTRAVENOUS
  Administered 2018-09-22: 2 mg via INTRAVENOUS

## 2018-09-22 MED ORDER — OXYCODONE HCL 5 MG PO TABS: 5.0000 mg | ORAL_TABLET | Freq: Once | ORAL | Status: DC | PRN

## 2018-09-22 MED ORDER — PROPOFOL 500 MG/50ML IV EMUL
INTRAVENOUS | Status: AC
  Filled 2018-09-22: qty 50

## 2018-09-22 MED ORDER — FENTANYL CITRATE (PF) 100 MCG/2ML IJ SOLN
INTRAMUSCULAR | Status: DC | PRN
  Administered 2018-09-22: 15 ug via INTRAVENOUS
  Administered 2018-09-22: 85 ug via INTRAVENOUS

## 2018-09-22 MED ORDER — KETOROLAC TROMETHAMINE 30 MG/ML IJ SOLN: 30.0000 mg | Freq: Once | INTRAMUSCULAR | Status: DC

## 2018-09-22 MED ORDER — IBUPROFEN 600 MG PO TABS: 600.0000 mg | ORAL_TABLET | Freq: Four times a day (QID) | ORAL | Status: DC | PRN

## 2018-09-22 MED ORDER — BUPIVACAINE HCL (PF) 0.5 % IJ SOLN
INTRAMUSCULAR | Status: DC | PRN
  Administered 2018-09-22: 5 mL

## 2018-09-22 MED ORDER — FENTANYL CITRATE (PF) 100 MCG/2ML IJ SOLN: 25.0000 ug | INTRAMUSCULAR | Status: DC | PRN

## 2018-09-22 MED ORDER — KETOROLAC TROMETHAMINE 30 MG/ML IJ SOLN
30.0000 mg | Freq: Four times a day (QID) | INTRAMUSCULAR | Status: DC
  Administered 2018-09-22 (×2): 30 mg via INTRAVENOUS
  Filled 2018-09-22 (×2): qty 1

## 2018-09-22 MED ORDER — BUPIVACAINE HCL (PF) 0.5 % IJ SOLN
INTRAMUSCULAR | Status: AC
  Filled 2018-09-22: qty 30

## 2018-09-22 MED ORDER — ONDANSETRON HCL 4 MG/2ML IJ SOLN
INTRAMUSCULAR | Status: DC | PRN
  Administered 2018-09-22: 4 mg via INTRAVENOUS

## 2018-09-22 MED ORDER — PROMETHAZINE HCL 25 MG/ML IJ SOLN: 6.2500 mg | INTRAMUSCULAR | Status: DC | PRN

## 2018-09-22 MED ORDER — KETOROLAC TROMETHAMINE 30 MG/ML IJ SOLN: 30.0000 mg | Freq: Four times a day (QID) | INTRAMUSCULAR | Status: DC

## 2018-09-22 SURGICAL SUPPLY — 26 items
BENZOIN TINCTURE PRP APPL 2/3 (GAUZE/BANDAGES/DRESSINGS) ×3 IMPLANT
BLADE SURG 15 STRL LF DISP TIS (BLADE) ×1 IMPLANT
BLADE SURG 15 STRL SS (BLADE) ×2
BNDG ADH 2 X3.75 FABRIC TAN LF (GAUZE/BANDAGES/DRESSINGS) ×3 IMPLANT
CHLORAPREP W/TINT 26ML (MISCELLANEOUS) ×3 IMPLANT
CLIP FILSHIE TUBAL LIGA STRL (Clip) ×6 IMPLANT
CLOSURE WOUND 1/2 X4 (GAUZE/BANDAGES/DRESSINGS) ×1
COVER WAND RF STERILE (DRAPES) ×3 IMPLANT
DRAPE LAPAROTOMY 77X122 PED (DRAPES) ×3 IMPLANT
DRSG TEGADERM 2-3/8X2-3/4 SM (GAUZE/BANDAGES/DRESSINGS) ×3 IMPLANT
DRSG TELFA 4X3 1S NADH ST (GAUZE/BANDAGES/DRESSINGS) ×3 IMPLANT
GLOVE BIOGEL PI ORTHO PRO 7.5 (GLOVE) ×2
GLOVE PI ORTHO PRO STRL 7.5 (GLOVE) ×1 IMPLANT
GOWN STRL REUS W/ TWL LRG LVL3 (GOWN DISPOSABLE) ×2 IMPLANT
GOWN STRL REUS W/TWL LRG LVL3 (GOWN DISPOSABLE) ×4
KIT TURNOVER CYSTO (KITS) ×3 IMPLANT
LABEL OR SOLS (LABEL) ×3 IMPLANT
NEEDLE HYPO 25GX1X1/2 BEV (NEEDLE) ×3 IMPLANT
NS IRRIG 500ML POUR BTL (IV SOLUTION) ×3 IMPLANT
PACK BASIN MINOR ARMC (MISCELLANEOUS) ×3 IMPLANT
RETRACTOR RING XSMALL (MISCELLANEOUS) ×1 IMPLANT
RTRCTR WOUND ALEXIS 13CM XS SH (MISCELLANEOUS) ×3
STRIP CLOSURE SKIN 1/2X4 (GAUZE/BANDAGES/DRESSINGS) ×2 IMPLANT
SUT VIC AB 4-0 PS2 18 (SUTURE) ×3 IMPLANT
SUT VICRYL 0 AB UR-6 (SUTURE) ×6 IMPLANT
SYR 10ML LL (SYRINGE) ×3 IMPLANT

## 2018-09-22 NOTE — Anesthesia Preprocedure Evaluation (Signed)
Anesthesia Evaluation  Patient identified by MRN, date of birth, ID band Patient awake    Reviewed: Allergy & Precautions, NPO status , Patient's Chart, lab work & pertinent test results  History of Anesthesia Complications Negative for: history of anesthetic complications  Airway Mallampati: II  TM Distance: >3 FB Neck ROM: Full    Dental no notable dental hx.    Pulmonary neg pulmonary ROS, neg sleep apnea, neg COPD,    breath sounds clear to auscultation- rhonchi (-) wheezing      Cardiovascular Exercise Tolerance: Good (-) hypertension(-) CAD and (-) Past MI  Rhythm:Regular Rate:Normal - Systolic murmurs and - Diastolic murmurs    Neuro/Psych negative neurological ROS  negative psych ROS   GI/Hepatic negative GI ROS, Neg liver ROS,   Endo/Other  negative endocrine ROSneg diabetes  Renal/GU Renal disease: hx of nephrolithiasis.negative Renal ROS     Musculoskeletal negative musculoskeletal ROS (+)   Abdominal (+) + obese,   Peds  Hematology negative hematology ROS (+)   Anesthesia Other Findings   Reproductive/Obstetrics (+) Breast feeding                              Lab Results  Component Value Date   WBC 10.1 09/22/2018   HGB 9.0 (L) 09/22/2018   HCT 28.4 (L) 09/22/2018   MCV 87.9 09/22/2018   PLT 121 (L) 09/22/2018    Anesthesia Physical Anesthesia Plan  ASA: II  Anesthesia Plan: Spinal   Post-op Pain Management:    Induction:   PONV Risk Score and Plan: 2 and Ondansetron and Midazolam  Airway Management Planned: Natural Airway  Additional Equipment:   Intra-op Plan:   Post-operative Plan:   Informed Consent: I have reviewed the patients History and Physical, chart, labs and discussed the procedure including the risks, benefits and alternatives for the proposed anesthesia with the patient or authorized representative who has indicated his/her understanding and  acceptance.   Dental advisory given  Plan Discussed with: CRNA and Anesthesiologist  Anesthesia Plan Comments:         Anesthesia Quick Evaluation

## 2018-09-22 NOTE — Progress Notes (Signed)
Pts bladder scanned for less than 140cc of urine, no futher measures at this time

## 2018-09-22 NOTE — Progress Notes (Signed)
Progress Note - Vaginal Delivery  Stephanie Marquez is a 32 y.o. [phone] now PP day 1 s/p Vaginal, Spontaneous . Having postpartum tubal today @ 2pm.   Subjective:  The patient reports no complaints, up ad lib, voiding, tolerating PO and + flatus   Objective:  Vital signs in last 24 hours: Temp:  [98 F (36.7 C)-98.6 F (37 C)] 98.4 F (36.9 C) (10/25 0013) Pulse Rate:  [68-119] 74 (10/25 0013) Resp:  [18-20] 20 (10/25 0013) BP: (95-159)/(59-95) 130/79 (10/25 0013) SpO2:  [98 %-100 %] 99 % (10/25 0013)  Physical Exam:  General: alert, cooperative, appears stated age and fatigued Lochia: appropriate Uterine Fundus: firm DVT Evaluation: No evidence of DVT seen on physical exam. No cords or calf tenderness. No significant calf/ankle edema.    Data Review Recent Labs    09/20/18 2232 09/22/18 0429  HGB 10.2* 9.0*  HCT 31.6* 28.4*    Assessment/Plan: Active Problems:   Labor and delivery, indication for care   Plan for discharge tomorrow   -- Continue routine PP care.     Doreene Burke, CNM  09/22/2018 7:47 AM

## 2018-09-22 NOTE — Lactation Note (Signed)
This note was copied from a baby's chart. Lactation Consultation Note  Patient Name: Stephanie Marquez Today's Date: 09/22/2018     Maternal Data Formula Feeding for Exclusion: No Has patient been taught Hand Expression?: Yes Does the patient have breastfeeding experience prior to this delivery?: Yes  Feeding    LATCH Score                   Interventions    Lactation Tools Discussed/Used     Consult Status  LC to room during morning rounds to talk to mother about breastfeeding. Mother states that infant is breastfeeding well and denies any pain or concerns. Mother states that she is having BTL procedure this afternoon. LC provided mother with a pump kit to initiate pumping temporarily to have milk for infant while she is in surgery. Mother is going to pump twice before the procedure and will breastfeed infant before leaving the unit but is also ok with supplementing with formula if infant needs to eat while she is gone.    Alicia Lilly 09/22/2018, 12:06 PM    

## 2018-09-22 NOTE — Transfer of Care (Signed)
Immediate Anesthesia Transfer of Care Note  Patient: Chrystine Frogge  Procedure(s) Performed: POST PARTUM TUBAL LIGATION (N/A )  Patient Location: PACU  Anesthesia Type:spinal  Level of Consciousness: sedated  Airway & Oxygen Therapy: Patient Spontanous Breathing and Patient connected to face mask oxygen  Post-op Assessment: Report given to RN and Post -op Vital signs reviewed and stable  Post vital signs: Reviewed and stable  Last Vitals:  Vitals Value Taken Time  BP 108/63 09/22/2018  2:40 PM  Temp 36.1 C 09/22/2018  2:39 PM  Pulse 75 09/22/2018  2:48 PM  Resp 16 09/22/2018  2:48 PM  SpO2 100 % 09/22/2018  2:48 PM  Vitals shown include unvalidated device data.  Last Pain:  Vitals:   09/22/18 1439  TempSrc:   PainSc: 0-No pain      Patients Stated Pain Goal: 1 (09/22/18 0040)  Complications: No apparent anesthesia complications

## 2018-09-22 NOTE — Anesthesia Postprocedure Evaluation (Signed)
Anesthesia Post Note  Patient: Stephanie Marquez  Procedure(s) Performed: AN AD HOC LABOR EPIDURAL  Patient location during evaluation: Mother Baby Anesthesia Type: Epidural Level of consciousness: awake and alert Pain management: pain level controlled Vital Signs Assessment: post-procedure vital signs reviewed and stable Respiratory status: spontaneous breathing, nonlabored ventilation and respiratory function stable Cardiovascular status: stable Postop Assessment: no headache, no backache and epidural receding Anesthetic complications: no     Last Vitals:  Vitals:   09/21/18 1659 09/22/18 0013  BP: 125/79 130/79  Pulse: 68 74  Resp: 18 20  Temp: 36.9 C 36.9 C  SpO2: 98% 99%    Last Pain:  Vitals:   09/22/18 0040  TempSrc:   PainSc: 6                  Starling Manns

## 2018-09-22 NOTE — Anesthesia Procedure Notes (Signed)
Spinal  Patient location during procedure: OR Start time: 09/22/2018 1:58 PM End time: 09/22/2018 2:00 PM Staffing Anesthesiologist: Emmie Niemann, MD Resident/CRNA: Nelda Marseille, CRNA Performed: resident/CRNA  Preanesthetic Checklist Completed: patient identified, site marked, surgical consent, pre-op evaluation, timeout performed, IV checked, risks and benefits discussed and monitors and equipment checked Spinal Block Patient position: sitting Prep: ChloraPrep Patient monitoring: heart rate, continuous pulse ox and blood pressure Approach: midline Location: L4-5 Injection technique: single-shot Needle Needle type: Introducer and Pencil-Tip  Needle gauge: 24 G Needle length: 9 cm Additional Notes Negative paresthesia. Negative blood return. Positive free-flowing CSF. Expiration date of kit checked and confirmed. Patient tolerated procedure well, without complications.

## 2018-09-22 NOTE — Anesthesia Post-op Follow-up Note (Signed)
Anesthesia QCDR form completed.        

## 2018-09-22 NOTE — Progress Notes (Signed)
Patient transported to OR for postpartum bilateral tubal ligation at this time. Pre-procedure checklist complete.     Teyah Rossy Garner, RN  

## 2018-09-22 NOTE — Op Note (Signed)
      OPERATIVE NOTE 09/22/2018 2:39 PM  PRE-OPERATIVE DIAGNOSIS:  1) Desires perm sterilization  POST-OPERATIVE DIAGNOSIS:  Same  OPERATION:  Post-partum Filshie clip tubal occlusion for sterilization  SURGEON(S): Surgeon(s) and Role:    Linzie Collin, MD - Primary   ANESTHESIA: Choice  ESTIMATED BLOOD LOSS: 3ml OPERATIVE FINDINGS: Previously removed R tube.  Normal L.  SPECIMEN: * No specimens in log *  COMPLICATIONS: None  DISPOSITION: Stable to recovery room  DESCRIPTION OF PROCEDURE:     The patient was prepped and draped in the supine position and placed under general anesthesia. The bladder was emptied.  A small infraumbilical incision was made fascia was identified and incised using Mayo scissors. She was carefully identified and entered with care being taken to avoid bowel. Right angle retractors were placed followed by an Teacher, early years/pre.  The right fallopian tube was identified and found to be truncated @ 2 cm from the cornua.  It was occluded using a Filshie clip. The left fallopian tube was similarly identified and the midportion of the tube was completely occluded using a Filshie clip in a perpendicular manner. Hemostasis of the fallopian tubes as well as the pelvis was noted.  A deep suture through the fascia of 0 Vicryl followed by subcuticular closure of the skin was performed. A long-acting anesthetic was injected.  Steri-Strips were applied. The patient went to the recovery room in stable condition.   Elonda Husky, M.D. 09/22/2018 2:39 PM

## 2018-09-23 MED ORDER — DOCUSATE SODIUM 100 MG PO CAPS: 100.0000 mg | ORAL_CAPSULE | Freq: Two times a day (BID) | ORAL | 0 refills | Status: DC

## 2018-09-23 MED ORDER — OXYCODONE-ACETAMINOPHEN 5-325 MG PO TABS: 1.0000 | ORAL_TABLET | ORAL | 0 refills | Status: DC | PRN

## 2018-09-23 MED ORDER — IBUPROFEN 600 MG PO TABS: 600.0000 mg | ORAL_TABLET | Freq: Four times a day (QID) | ORAL | 0 refills | Status: DC

## 2018-09-23 NOTE — Progress Notes (Signed)
Discharge instructions complete and prescriptions given. Patient verbalizes understanding of teaching. Patient discharged home via wheelchair at 1135.

## 2018-09-23 NOTE — Discharge Summary (Signed)
Discharge Summary  Date of Admission: 09/20/2018  Date of Discharge: 09/23/2018  Admitting Diagnosis: Induction of labor at [redacted]w[redacted]d  Mode of Delivery: normal spontaneous vaginal delivery                 Discharge Diagnosis: No other diagnosis   Intrapartum Procedures: epidural, laceration 2nd, pitocin augmentation and tubal ligation   Post partum procedures: postpartum tubal ligation  Complications: none                      Discharge Day SOAP Note:  Progress Note - Vaginal Delivery  Stephanie Marquez is a 32 y.o. [id number] now PP day 2 s/p Vaginal, Spontaneous . Delivery was uncomplicated  Subjective  The patient has the following complaints: has no unusual complaints  Pain is controlled with current medications.   Patient is urinating without difficulty.  She is ambulating well.    Objective  Vital signs: BP 129/80 (BP Location: Right Arm)   Pulse 80   Temp 98.7 F (37.1 C) (Oral)   Resp 20   Ht 5\' 1"  (1.549 m)   Wt 78.5 kg   LMP 12/19/2017   SpO2 97%   Breastfeeding? Yes   BMI 32.70 kg/m   Physical Exam: Gen: NAD Fundus Fundal Tone: Firm  Lochia Amount: Small  Perineum Appearance: Edematous, Approximated     Data Review Labs: CBC Latest Ref Rng & Units 09/22/2018 09/20/2018 05/11/2018  WBC 4.0 - 10.5 K/uL 10.1 10.6(H) 9.0  Hemoglobin 12.0 - 15.0 g/dL 9.0(L) 10.2(L) 11.9  Hematocrit 36.0 - 46.0 % 28.4(L) 31.6(L) 33.5(L)  Platelets 150 - 400 K/uL 121(L) 164 204   O POS  Assessment/Plan  Active Problems:   Labor and delivery, indication for care    Plan for discharge today.   Discharge Instructions: Per After Visit Summary. Activity: Advance as tolerated. Pelvic rest for 6 weeks.  Also refer to After Visit Summary Diet: Regular Medications: Allergies as of 09/23/2018      Reactions   Bactrim [sulfamethoxazole-trimethoprim] Hives, Swelling      Medication List    STOP taking these medications   aspirin EC 81 MG tablet      TAKE these medications   docusate sodium 100 MG capsule Commonly known as:  COLACE Take 1 capsule (100 mg total) by mouth 2 (two) times daily.   ibuprofen 600 MG tablet Commonly known as:  ADVIL,MOTRIN Take 1 tablet (600 mg total) by mouth every 6 (six) hours.   oxyCODONE-acetaminophen 5-325 MG tablet Commonly known as:  PERCOCET/ROXICET Take 1 tablet by mouth every 4 (four) hours as needed (pain scale 4-7).   prenatal multivitamin Tabs tablet Take 1 tablet by mouth daily at 12 noon.      Outpatient follow up: 6 wks with Doreene Burke, CNM  Postpartum contraception: Will discuss at first office visit post-partum  Discharged Condition: good  Discharged to: home  Newborn Data: Disposition:home with mother  Apgars: APGAR (1 MIN): 8   APGAR (5 MINS): 9   APGAR (10 MINS):    Baby Feeding: Breast  Doreene Burke, CNM  [facility] 09/23/2018 10:14 AM

## 2018-09-23 NOTE — Anesthesia Post-op Follow-up Note (Signed)
  Anesthesia Pain Follow-up Note  Patient: Stephanie Marquez  Day #: 1  Date of Follow-up: 09/23/2018 Time: 8:34 AM  Last Vitals:  Vitals:   09/22/18 2025 09/23/18 0502  BP: 114/73 108/83  Pulse: 78 74  Resp: 18 18  Temp: 36.9 C 36.7 C  SpO2: 97% 98%    Level of Consciousness: alert  Pain: mild   Side Effects:None  Catheter Site Exam:clean, dry     Plan: D/C from anesthesia care at surgeon's request  Cleda Mccreedy Bakari Nikolai

## 2018-09-23 NOTE — Anesthesia Postprocedure Evaluation (Signed)
Anesthesia Post Note  Patient: Stephanie Marquez  Procedure(s) Performed: POST PARTUM TUBAL LIGATION (N/A )  Patient location during evaluation: Mother Baby Anesthesia Type: Spinal Level of consciousness: oriented and awake and alert Pain management: pain level controlled Vital Signs Assessment: post-procedure vital signs reviewed and stable Respiratory status: spontaneous breathing Cardiovascular status: blood pressure returned to baseline and stable Postop Assessment: no headache, no backache, no apparent nausea or vomiting, patient able to bend at knees and able to ambulate Anesthetic complications: no     Last Vitals:  Vitals:   09/22/18 2025 09/23/18 0502  BP: 114/73 108/83  Pulse: 78 74  Resp: 18 18  Temp: 36.9 C 36.7 C  SpO2: 97% 98%    Last Pain:  Vitals:   09/23/18 0622  TempSrc:   PainSc: 6                  Cleda Mccreedy Piscitello

## 2018-09-23 NOTE — Anesthesia Postprocedure Evaluation (Signed)
Anesthesia Post Note  Patient: Stephanie Marquez  Procedure(s) Performed: AN AD HOC LABOR EPIDURAL  Patient location during evaluation: Mother Baby Anesthesia Type: Epidural Level of consciousness: awake and alert Pain management: pain level controlled Vital Signs Assessment: post-procedure vital signs reviewed and stable Respiratory status: spontaneous breathing, nonlabored ventilation and respiratory function stable Cardiovascular status: stable Postop Assessment: no headache, no backache, patient able to bend at knees and able to ambulate Anesthetic complications: no     Last Vitals:  Vitals:   09/22/18 2025 09/23/18 0502  BP: 114/73 108/83  Pulse: 78 74  Resp: 18 18  Temp: 36.9 C 36.7 C  SpO2: 97% 98%    Last Pain:  Vitals:   09/23/18 0622  TempSrc:   PainSc: 6                  Cleda Mccreedy Dominque Marlin

## 2018-09-23 NOTE — Final Progress Note (Signed)
Discharge Day SOAP Note:  Progress Note - Vaginal Delivery  Stephanie Marquez is a 32 y.o. [id number] now PP day 2 s/p Vaginal, Spontaneous . Delivery was uncomplicated  Subjective  The patient has the following complaints: has no unusual complaints  Pain is controlled with current medications.   Patient is urinating without difficulty.  She is ambulating well.    Objective  Vital signs: BP 129/80 (BP Location: Right Arm)   Pulse 80   Temp 98.7 F (37.1 C) (Oral)   Resp 20   Ht 5\' 1"  (1.549 m)   Wt 78.5 kg   LMP 12/19/2017   SpO2 97%   Breastfeeding? Yes   BMI 32.70 kg/m   Physical Exam: Gen: NAD Fundus Fundal Tone: Firm  Lochia Amount: Small  Perineum Appearance: Edematous, Approximated     Data Review Labs: CBC Latest Ref Rng & Units 09/22/2018 09/20/2018 05/11/2018  WBC 4.0 - 10.5 K/uL 10.1 10.6(H) 9.0  Hemoglobin 12.0 - 15.0 g/dL 9.0(L) 10.2(L) 11.9  Hematocrit 36.0 - 46.0 % 28.4(L) 31.6(L) 33.5(L)  Platelets 150 - 400 K/uL 121(L) 164 204   O POS  Assessment/Plan  Active Problems:   Labor and delivery, indication for care    Plan for discharge today.   Discharge Instructions: Per After Visit Summary. Activity: Advance as tolerated. Pelvic rest for 6 weeks.  Also refer to After Visit Summary Diet: Regular Medications: Allergies as of 09/23/2018      Reactions   Bactrim [sulfamethoxazole-trimethoprim] Hives, Swelling      Medication List    STOP taking these medications   aspirin EC 81 MG tablet     TAKE these medications   docusate sodium 100 MG capsule Commonly known as:  COLACE Take 1 capsule (100 mg total) by mouth 2 (two) times daily.   ibuprofen 600 MG tablet Commonly known as:  ADVIL,MOTRIN Take 1 tablet (600 mg total) by mouth every 6 (six) hours.   oxyCODONE-acetaminophen 5-325 MG tablet Commonly known as:  PERCOCET/ROXICET Take 1 tablet by mouth every 4 (four) hours as needed (pain scale 4-7).   prenatal multivitamin  Tabs tablet Take 1 tablet by mouth daily at 12 noon.      Outpatient follow up: 6 wks with Doreene Burke, CNM  Postpartum contraception: Will discuss at first office visit post-partum  Discharged Condition: good  Discharged to: home  Newborn Data: Disposition:home with mother  Apgars: APGAR (1 MIN): 8   APGAR (5 MINS): 9   APGAR (10 MINS):    Baby Feeding: Breast  Doreene Burke, CNM  [facility] 09/23/2018 10:14 AM

## 2018-09-23 NOTE — Lactation Note (Signed)
This note was copied from a baby's chart. Lactation Consultation Note  Patient Name: Stephanie Marquez [id number] Date: 09/23/2018 Reason for consult: Initial assessment;Term;Other (Comment)(Slight tongue tie, Sleepy baby falling asleep at breast) Mom reports baby being sleepy since yesterday.  Waking techniques demonstrated, but would fall back to sleep as soon as we got her latched at the breast.  Baby has a slight tongue tie, but can extend tongue beyond gum line.  Mom could easily hand express lots of colostrum.  Mom had pre pumped 7 ml.  Tried  #20 nipple shield at the breast.  Still not much sucking.  Put mom's expressed colostrum in tip of nipple shield which enticed her to take a few more sucks.  Gave a little more of colostrum in curved tip syringe along side of nipple shield.  Every time we gave some via curved tip syringe along side of NS, she would do a little rhythmic sucking with swallows.  Demonstrated how to massage breast and stimulate baby to keep her awake to take in all of expressed colostrum.  Mom reports right breast feedling fuller before pumping and feeding and softer after pumping and breast feeding.  Mom reports having a DEBP at home in case she has to pump and give bottle for short period until mature milk trainsitions.  Reviewed supply and demand, normal course of lactation and routine newborn feeding patterns.  Mom plans to be discharged today.  Reviewed community lactation resources if further assistance was needed.  Lactation name and number written on white board and encouraged to call with any questions, concerns or assistance.      Maternal Data Formula Feeding for Exclusion: No Has patient been taught Hand Expression?: Yes(Can easily hand express colostrum) Does the patient have breastfeeding experience prior to this delivery?: Yes  Feeding Feeding Type: Breast Fed  LATCH Score Latch: Repeated attempts needed to sustain latch, nipple held in mouth throughout feeding,  stimulation needed to elicit sucking reflex.  Audible Swallowing: A few with stimulation  Type of Nipple: Everted at rest and after stimulation  Comfort (Breast/Nipple): Filling, red/small blisters or bruises, mild/mod discomfort(Mom feeling fuller especially in right breast)  Hold (Positioning): Assistance needed to correctly position infant at breast and maintain latch.  LATCH Score: 6  Interventions Interventions: Breast feeding basics reviewed;Reverse pressure;Expressed milk;DEBP;Assisted with latch;Breast compression;Skin to skin;Adjust position;Breast massage;Support pillows;Hand express;Position options;Pre-pump if needed  Lactation Tools Discussed/Used Tools: Pump;Other (comment);Nipple Shields(Expressed colostrum in tip & alongside of NS using curved tip syringe) Nipple shield size: 20 Breast pump type: Double-Electric Breast Pump WIC Program: Yes Pump Review: Setup, frequency, and cleaning;Milk Storage   Consult Status Consult Status: PRN    Louis Meckel 09/23/2018, 11:36 AM

## 2018-09-25 ENCOUNTER — Encounter: Payer: Self-pay | Admitting: Obstetrics and Gynecology

## 2018-11-14 ENCOUNTER — Ambulatory Visit (INDEPENDENT_AMBULATORY_CARE_PROVIDER_SITE_OTHER): Payer: Medicaid Other | Admitting: Certified Nurse Midwife

## 2018-11-14 ENCOUNTER — Encounter: Payer: Self-pay | Admitting: Certified Nurse Midwife

## 2018-11-14 NOTE — Progress Notes (Signed)
Subjective:    Stephanie RogerKaty Marquez is a 32 y.o. [phone] Caucasian female who presents for a postpartum visit. She is 6 weeks postpartum following a spontaneous vaginal delivery at 39.3 gestational weeks. Anesthesia: epidural. I have fully reviewed the prenatal and intrapartum course. Postpartum course has been WNL. Baby's course has been WNL. Baby is feeding by breast. Bleeding no bleeding. Bowel function is normal. Bladder function is normal. Patient is not sexually active. Last sexual activity: prior to delivery Contraception method is tubal ligation. Postpartum depression screening: negative. Score 0.  Last pap 03/13/18 and was negative. HPV negative.  The following portions of the patient's history were reviewed and updated as appropriate: allergies, current medications, past medical history, past surgical history and problem list.  Review of Systems Pertinent items are noted in HPI.   Vitals:   11/14/18 0950  BP: 115/75  Pulse: 81  Weight: 148 lb 7 oz (67.3 kg)  Height: 5\' 1"  (1.549 m)   No LMP recorded.  Objective:   General:  alert, cooperative and no distress   Breasts:  deferred, no complaints  Lungs: clear to auscultation bilaterally  Heart:  regular rate and rhythm  Abdomen: soft, nontender   Vulva: normal  Vagina: normal vagina  Cervix:  closed  Corpus: Well-involuted  Adnexa:  Non-palpable  Rectal Exam: No hemorrhoids        Assessment:   Postpartum exam 6  wks s/p SVD Breastfeeding Depression screening Contraception counseling   Plan:  : tubal ligation Follow up in: 1 yr for annual or earlier if needed  Doreene BurkeAnnie Gurtaj Ruz, CNM

## 2018-11-14 NOTE — Patient Instructions (Signed)
Preventive Care 18-39 Years, Female Preventive care refers to lifestyle choices and visits with your health care provider that can promote health and wellness. What does preventive care include?  A yearly physical exam. This is also called an annual well check.  Dental exams once or twice a year.  Routine eye exams. Ask your health care provider how often you should have your eyes checked.  Personal lifestyle choices, including: ? Daily care of your teeth and gums. ? Regular physical activity. ? Eating a healthy diet. ? Avoiding tobacco and drug use. ? Limiting alcohol use. ? Practicing safe sex. ? Taking vitamin and mineral supplements as recommended by your health care provider. What happens during an annual well check? The services and screenings done by your health care provider during your annual well check will depend on your age, overall health, lifestyle risk factors, and family history of disease. Counseling Your health care provider may ask you questions about your:  Alcohol use.  Tobacco use.  Drug use.  Emotional well-being.  Home and relationship well-being.  Sexual activity.  Eating habits.  Work and work Statistician.  Method of birth control.  Menstrual cycle.  Pregnancy history.  Screening You may have the following tests or measurements:  Height, weight, and BMI.  Diabetes screening. This is done by checking your blood sugar (glucose) after you have not eaten for a while (fasting).  Blood pressure.  Lipid and cholesterol levels. These may be checked every 5 years starting at age 66.  Skin check.  Hepatitis C blood test.  Hepatitis B blood test.  Sexually transmitted disease (STD) testing.  BRCA-related cancer screening. This may be done if you have a family history of breast, ovarian, tubal, or peritoneal cancers.  Pelvic exam and Pap test. This may be done every 3 years starting at age 40. Starting at age 59, this may be done every 5  years if you have a Pap test in combination with an HPV test.  Discuss your test results, treatment options, and if necessary, the need for more tests with your health care provider. Vaccines Your health care provider may recommend certain vaccines, such as:  Influenza vaccine. This is recommended every year.  Tetanus, diphtheria, and acellular pertussis (Tdap, Td) vaccine. You may need a Td booster every 10 years.  Varicella vaccine. You may need this if you have not been vaccinated.  HPV vaccine. If you are 69 or younger, you may need three doses over 6 months.  Measles, mumps, and rubella (MMR) vaccine. You may need at least one dose of MMR. You may also need a second dose.  Pneumococcal 13-valent conjugate (PCV13) vaccine. You may need this if you have certain conditions and were not previously vaccinated.  Pneumococcal polysaccharide (PPSV23) vaccine. You may need one or two doses if you smoke cigarettes or if you have certain conditions.  Meningococcal vaccine. One dose is recommended if you are age 27-21 years and a first-year college student living in a residence hall, or if you have one of several medical conditions. You may also need additional booster doses.  Hepatitis A vaccine. You may need this if you have certain conditions or if you travel or work in places where you may be exposed to hepatitis A.  Hepatitis B vaccine. You may need this if you have certain conditions or if you travel or work in places where you may be exposed to hepatitis B.  Haemophilus influenzae type b (Hib) vaccine. You may need this if  you have certain risk factors.  Talk to your health care provider about which screenings and vaccines you need and how often you need them. This information is not intended to replace advice given to you by your health care provider. Make sure you discuss any questions you have with your health care provider. Document Released: 01/11/2002 Document Revised: 08/04/2016  Document Reviewed: 09/16/2015 Elsevier Interactive Patient Education  [employer].

## 2020-02-24 ENCOUNTER — Emergency Department
Admission: EM | Admit: 2020-02-24 | Discharge: 2020-02-24 | Disposition: A | Discharge status: Home / Self Care | Payer: Self-pay | Attending: Emergency Medicine | Admitting: Emergency Medicine

## 2020-02-24 ENCOUNTER — Encounter: Payer: Self-pay | Admitting: Emergency Medicine

## 2020-02-24 ENCOUNTER — Other Ambulatory Visit: Payer: Self-pay

## 2020-02-24 ENCOUNTER — Emergency Department: Payer: Self-pay

## 2020-02-24 DIAGNOSIS — R1032 Left lower quadrant pain: Secondary | ICD-10-CM | POA: Insufficient documentation

## 2020-02-24 DIAGNOSIS — R112 Nausea with vomiting, unspecified: Secondary | ICD-10-CM | POA: Insufficient documentation

## 2020-02-24 DIAGNOSIS — N200 Calculus of kidney: Secondary | ICD-10-CM | POA: Insufficient documentation

## 2020-02-24 DIAGNOSIS — N189 Chronic kidney disease, unspecified: Secondary | ICD-10-CM | POA: Insufficient documentation

## 2020-02-24 DIAGNOSIS — R109 Unspecified abdominal pain: Secondary | ICD-10-CM

## 2020-02-24 LAB — CBC
HCT: 41.5 % (ref 36.0–46.0)
Hemoglobin: 14.5 g/dL (ref 12.0–15.0)
MCH: 30.9 pg (ref 26.0–34.0)
MCHC: 34.9 g/dL (ref 30.0–36.0)
MCV: 88.5 fL (ref 80.0–100.0)
Platelets: 206 10*3/uL (ref 150–400)
RBC: 4.69 MIL/uL (ref 3.87–5.11)
RDW: 12.1 % (ref 11.5–15.5)
WBC: 6.4 10*3/uL (ref 4.0–10.5)
nRBC: 0 % (ref 0.0–0.2)

## 2020-02-24 LAB — URINALYSIS, COMPLETE (UACMP) WITH MICROSCOPIC
Bacteria, UA: NONE SEEN
Bilirubin Urine: NEGATIVE
Glucose, UA: NEGATIVE mg/dL
Ketones, ur: NEGATIVE mg/dL
Leukocytes,Ua: NEGATIVE
Nitrite: NEGATIVE
Protein, ur: NEGATIVE mg/dL
Specific Gravity, Urine: 1.014 (ref 1.005–1.030)
pH: 6 (ref 5.0–8.0)

## 2020-02-24 LAB — BASIC METABOLIC PANEL
Anion gap: 16 — ABNORMAL HIGH (ref 5–15)
BUN: 12 mg/dL (ref 6–20)
CO2: 19 mmol/L — ABNORMAL LOW (ref 22–32)
Calcium: 9.2 mg/dL (ref 8.9–10.3)
Chloride: 102 mmol/L (ref 98–111)
Creatinine, Ser: 0.9 mg/dL (ref 0.44–1.00)
GFR calc Af Amer: >60 mL/min (ref >60)
GFR calc non Af Amer: >60 mL/min (ref >60)
Glucose, Bld: 131 mg/dL — ABNORMAL HIGH (ref 70–99)
Potassium: 3.2 mmol/L — ABNORMAL LOW (ref 3.5–5.1)
Sodium: 137 mmol/L (ref 135–145)

## 2020-02-24 LAB — POCT PREGNANCY, URINE: Preg Test, Ur: NEGATIVE

## 2020-02-24 MED ORDER — ONDANSETRON 4 MG PO TBDP: 4.0000 mg | ORAL_TABLET | Freq: Three times a day (TID) | ORAL | 0 refills | Status: DC | PRN

## 2020-02-24 MED ORDER — ONDANSETRON HCL 4 MG/2ML IJ SOLN
4.0000 mg | Freq: Once | INTRAMUSCULAR | Status: AC
  Administered 2020-02-24: 11:00:00 4 mg via INTRAVENOUS

## 2020-02-24 MED ORDER — MORPHINE SULFATE (PF) 4 MG/ML IV SOLN
4.0000 mg | Freq: Once | INTRAVENOUS | Status: AC
  Administered 2020-02-24: 11:00:00 4 mg via INTRAVENOUS
  Filled 2020-02-24: qty 1

## 2020-02-24 MED ORDER — OXYCODONE-ACETAMINOPHEN 5-325 MG PO TABS: 1.0000 | ORAL_TABLET | ORAL | 0 refills | Status: AC | PRN

## 2020-02-24 MED ORDER — ONDANSETRON HCL 4 MG/2ML IJ SOLN
INTRAMUSCULAR | Status: AC
  Filled 2020-02-24: qty 2

## 2020-02-24 MED ORDER — LACTATED RINGERS IV BOLUS
1000.0000 mL | Freq: Once | INTRAVENOUS | Status: AC
  Administered 2020-02-24: 11:00:00 1000 mL via INTRAVENOUS

## 2020-02-24 NOTE — ED Triage Notes (Signed)
Pt arrived via POV with reports of L flank pain, states started several hours ago, pt arrived and started hyperventilating per first RN and stated that she feels like her "whole body is numb"  Pt reports having some diarrhea as well.  Pt given cold rag for forehead in triage.

## 2020-02-24 NOTE — ED Notes (Signed)
Pt able to move herself onto bench in triage room to put legs up.

## 2020-02-24 NOTE — ED Provider Notes (Signed)
[facility] Emergency Department Provider Note   ____________________________________________   First MD Initiated Contact with Patient 02/24/20 1025     (approximate)  I have reviewed the triage vital signs and the nursing notes.   HISTORY  Chief Complaint Flank Pain    HPI Stephanie Marquez is a 34 y.o. female with past medical history of nephrolithiasis who presents to the ED complaining of flank pain.  Patient reports onset of severe left-sided flank pain around 8:00 this morning radiating around to the left side of her groin.  She has begun to feel nauseous with repeated episodes of vomiting, but denies any diarrhea.  She has not had any fevers and denies any dysuria or hematuria.  She does describe symptoms as similar to prior kidney stones.  Her LMP was last week.        Past Medical History:  Diagnosis Date  . Chronic kidney disease   . Kidney stones     Patient Active Problem List   Diagnosis Date Noted  . Labor and delivery, indication for care 09/20/2018  . Pregnancy with history of ectopic pregnancy, antepartum 01/31/2018    Past Surgical History:  Procedure Laterality Date  . DILATION AND EVACUATION N/A 12/26/2016   Procedure: DILATATION AND EVACUATION;  Surgeon: Harlin Heys, MD;  Location: [facility] ORS;  Service: Gynecology;  Laterality: N/A;  . ECTOPIC PREGNANCY SURGERY    . TUBAL LIGATION N/A 09/22/2018   Procedure: POST PARTUM TUBAL LIGATION;  Surgeon: Harlin Heys, MD;  Location: [facility] ORS;  Service: Gynecology;  Laterality: N/A;    Prior to Admission medications   Medication Sig Start Date End Date Taking? Authorizing Provider  ibuprofen (ADVIL,MOTRIN) 600 MG tablet Take 1 tablet (600 mg total) by mouth every 6 (six) hours. 09/23/18   Philip Aspen, CNM  ondansetron (ZOFRAN ODT) 4 MG disintegrating tablet Take 1 tablet (4 mg total) by mouth every 8 (eight) hours as needed for nausea or vomiting. 02/24/20   Blake Divine,  MD  oxyCODONE-acetaminophen (PERCOCET) 5-325 MG tablet Take 1 tablet by mouth every 4 (four) hours as needed. 02/24/20 02/23/21  Blake Divine, MD  Prenatal Vit-Fe Fumarate-FA (PRENATAL MULTIVITAMIN) TABS tablet Take 1 tablet by mouth daily at 12 noon.    [provider]    Allergies Bactrim [sulfamethoxazole-trimethoprim]  Family History  Problem Relation Age of Onset  . Ovarian cancer Mother   . Rheum arthritis Father     Social History Social History   Tobacco Use  . Smoking status: Never Smoker  . Smokeless tobacco: Never Used  Substance Use Topics  . Alcohol use: No  . Drug use: No    Review of Systems  Constitutional: No fever/chills Eyes: No visual changes. ENT: No sore throat. Cardiovascular: Denies chest pain. Respiratory: Denies shortness of breath. Gastrointestinal: Positive for flank and abdominal pain.  Positive for nausea and vomiting.  No diarrhea.  No constipation. Genitourinary: Negative for dysuria. Musculoskeletal: Negative for back pain. Skin: Negative for rash. Neurological: Negative for headaches, focal weakness or numbness.  ____________________________________________   PHYSICAL EXAM:  VITAL SIGNS: ED Triage Vitals  Enc Vitals Group     BP 02/24/20 1005 130/85     Pulse Rate 02/24/20 1005 (!) 112     Resp 02/24/20 1005 18     Temp 02/24/20 1005 97.7 F (36.5 C)     Temp Source 02/24/20 1005 Oral     SpO2 02/24/20 1005 98 %     Weight 02/24/20  1005 140 lb (63.5 kg)     Height 02/24/20 1005 5\' 1"  (1.549 m)     Head Circumference --      Peak Flow --      Pain Score 02/24/20 1003 9     Pain Loc --      Pain Edu? --      Excl. in GC? --     Constitutional: Alert and oriented. Eyes: Conjunctivae are normal. Head: Atraumatic. Nose: No congestion/rhinnorhea. Mouth/Throat: Mucous membranes are moist. Neck: Normal ROM Cardiovascular: Normal rate, regular rhythm. Grossly normal heart sounds. Respiratory: Normal respiratory  effort.  No retractions. Lungs CTAB. Gastrointestinal: Soft and nontender.  No CVA tenderness bilaterally.  No distention. Genitourinary: deferred Musculoskeletal: No lower extremity tenderness nor edema. Neurologic:  Normal speech and language. No gross focal neurologic deficits are appreciated. Skin:  Skin is warm, dry and intact. No rash noted. Psychiatric: Mood and affect are normal. Speech and behavior are normal.  ____________________________________________   LABS (all labs ordered are listed, but only abnormal results are displayed)  Labs Reviewed  URINALYSIS, COMPLETE (UACMP) WITH MICROSCOPIC - Abnormal; Notable for the following components:      Result Value   Color, Urine YELLOW (*)    APPearance HAZY (*)    Hgb urine dipstick MODERATE (*)    All other components within normal limits  BASIC METABOLIC PANEL - Abnormal; Notable for the following components:   Potassium 3.2 (*)    CO2 19 (*)    Glucose, Bld 131 (*)    Anion gap 16 (*)    All other components within normal limits  CBC  POC URINE PREG, ED  POCT PREGNANCY, URINE    PROCEDURES  Procedure(s) performed (including Critical Care):  Procedures   ____________________________________________   INITIAL IMPRESSION / ASSESSMENT AND PLAN / ED COURSE       34 year old female presents to the ED complaining of acute onset left-sided flank pain radiating to her groin associated with nausea and vomiting.  I have a high suspicion for recurrent nephrolithiasis and we will assess with CT scan, treat nausea and pain with IV Zofran and morphine.  We will hydrate with IV fluids, pregnancy testing and UA are pending.  Pregnancy testing negative, UA without evidence of infection.  Lab work is also reassuring.  CT scan shows hydronephrosis but no definitive ureteral stone and given small amount of hematuria this all appears consistent with kidney stone that has potentially already passed.  Patient's symptoms are much  improved at this time and she is appropriate for discharge home, was provided with urology follow-up for use as needed.  She was counseled to return to the ED for new worsening symptoms, patient agrees with plan.     ____________________________________________   FINAL CLINICAL IMPRESSION(S) / ED DIAGNOSES  Final diagnoses:  Left flank pain  Kidney stone     ED Discharge Orders         Ordered    oxyCODONE-acetaminophen (PERCOCET) 5-325 MG tablet  Every 4 hours PRN     02/24/20 1331    ondansetron (ZOFRAN ODT) 4 MG disintegrating tablet  Every 8 hours PRN     02/24/20 1331           Note:  This document was prepared using Dragon voice recognition software and may include unintentional dictation errors.   02/26/20, MD 02/24/20 [id number]

## 2020-02-24 NOTE — ED Notes (Signed)
First Nurse Note: Pt to ED for left flank pain. Pt has hx/o stones. Pt appears uncomfortable at this time.

## 2024-02-08 ENCOUNTER — Emergency Department
Admission: EM | Admit: 2024-02-08 | Discharge: 2024-02-08 | Disposition: A | Discharge status: Home / Self Care | Payer: Self-pay | Attending: Emergency Medicine | Admitting: Emergency Medicine

## 2024-02-08 ENCOUNTER — Other Ambulatory Visit: Payer: Self-pay

## 2024-02-08 DIAGNOSIS — R112 Nausea with vomiting, unspecified: Secondary | ICD-10-CM

## 2024-02-08 DIAGNOSIS — E872 Acidosis, unspecified: Secondary | ICD-10-CM | POA: Insufficient documentation

## 2024-02-08 DIAGNOSIS — R197 Diarrhea, unspecified: Secondary | ICD-10-CM | POA: Insufficient documentation

## 2024-02-08 DIAGNOSIS — N189 Chronic kidney disease, unspecified: Secondary | ICD-10-CM | POA: Insufficient documentation

## 2024-02-08 DIAGNOSIS — E860 Dehydration: Secondary | ICD-10-CM | POA: Insufficient documentation

## 2024-02-08 LAB — CBC WITH DIFFERENTIAL/PLATELET
Abs Immature Granulocytes: 0.08 10*3/uL — ABNORMAL HIGH (ref 0.00–0.07)
Basophils Absolute: 0 10*3/uL (ref 0.0–0.1)
Basophils Relative: 0 %
Eosinophils Absolute: 0 10*3/uL (ref 0.0–0.5)
Eosinophils Relative: 0 %
HCT: 42.3 % (ref 36.0–46.0)
Hemoglobin: 15.2 g/dL — ABNORMAL HIGH (ref 12.0–15.0)
Immature Granulocytes: 1 %
Lymphocytes Relative: 8 %
Lymphs Abs: 1 10*3/uL (ref 0.7–4.0)
MCH: 30.6 pg (ref 26.0–34.0)
MCHC: 35.9 g/dL (ref 30.0–36.0)
MCV: 85.1 fL (ref 80.0–100.0)
Monocytes Absolute: 0.7 10*3/uL (ref 0.1–1.0)
Monocytes Relative: 6 %
Neutro Abs: 11.2 10*3/uL — ABNORMAL HIGH (ref 1.7–7.7)
Neutrophils Relative %: 85 %
Platelets: 256 10*3/uL (ref 150–400)
RBC: 4.97 MIL/uL (ref 3.87–5.11)
RDW: 12.3 % (ref 11.5–15.5)
WBC: 13.1 10*3/uL — ABNORMAL HIGH (ref 4.0–10.5)
nRBC: 0 % (ref 0.0–0.2)

## 2024-02-08 LAB — HEPATIC FUNCTION PANEL
ALT: 17 U/L (ref 0–44)
AST: 31 U/L (ref 15–41)
Albumin: 4.5 g/dL (ref 3.5–5.0)
Alkaline Phosphatase: 54 U/L (ref 38–126)
Bilirubin, Direct: <0.1 mg/dL (ref 0.0–0.2)
Total Bilirubin: 0.7 mg/dL (ref 0.0–1.2)
Total Protein: 8.2 g/dL — ABNORMAL HIGH (ref 6.5–8.1)

## 2024-02-08 LAB — MAGNESIUM: Magnesium: 1.9 mg/dL (ref 1.7–2.4)

## 2024-02-08 LAB — URINALYSIS, ROUTINE W REFLEX MICROSCOPIC
Bilirubin Urine: NEGATIVE
Glucose, UA: NEGATIVE mg/dL
Hgb urine dipstick: NEGATIVE
Ketones, ur: 5 mg/dL — AB
Leukocytes,Ua: NEGATIVE
Nitrite: NEGATIVE
Protein, ur: NEGATIVE mg/dL
Specific Gravity, Urine: 1.016 (ref 1.005–1.030)
pH: 8 (ref 5.0–8.0)

## 2024-02-08 LAB — RESP PANEL BY RT-PCR (RSV, FLU A&B, COVID)  RVPGX2
Influenza A by PCR: NEGATIVE
Influenza B by PCR: NEGATIVE
Resp Syncytial Virus by PCR: NEGATIVE
SARS Coronavirus 2 by RT PCR: NEGATIVE

## 2024-02-08 LAB — BASIC METABOLIC PANEL
Anion gap: 16 — ABNORMAL HIGH (ref 5–15)
Anion gap: 5 (ref 5–15)
BUN: 14 mg/dL (ref 6–20)
BUN: 13 mg/dL (ref 6–20)
CO2: 15 mmol/L — ABNORMAL LOW (ref 22–32)
CO2: 20 mmol/L — ABNORMAL LOW (ref 22–32)
Calcium: 9.6 mg/dL (ref 8.9–10.3)
Calcium: 7.9 mg/dL — ABNORMAL LOW (ref 8.9–10.3)
Chloride: 105 mmol/L (ref 98–111)
Chloride: 112 mmol/L — ABNORMAL HIGH (ref 98–111)
Creatinine, Ser: 0.94 mg/dL (ref 0.44–1.00)
Creatinine, Ser: 0.75 mg/dL (ref 0.44–1.00)
GFR, Estimated: >60 mL/min (ref >60)
GFR, Estimated: >60 mL/min (ref >60)
Glucose, Bld: 165 mg/dL — ABNORMAL HIGH (ref 70–99)
Glucose, Bld: 106 mg/dL — ABNORMAL HIGH (ref 70–99)
Potassium: 3.2 mmol/L — ABNORMAL LOW (ref 3.5–5.1)
Potassium: 4.6 mmol/L (ref 3.5–5.1)
Sodium: 136 mmol/L (ref 135–145)
Sodium: 137 mmol/L (ref 135–145)

## 2024-02-08 LAB — LIPASE, BLOOD: Lipase: 33 U/L (ref 11–51)

## 2024-02-08 LAB — HCG, QUANTITATIVE, PREGNANCY: hCG, Beta Chain, Quant, S: <1 m[IU]/mL (ref <5)

## 2024-02-08 MED ORDER — SODIUM CHLORIDE 0.9 % IV BOLUS (SEPSIS)
1000.0000 mL | Freq: Once | INTRAVENOUS | Status: AC
  Administered 2024-02-08: 1000 mL via INTRAVENOUS

## 2024-02-08 MED ORDER — ONDANSETRON HCL 4 MG/2ML IJ SOLN
4.0000 mg | Freq: Once | INTRAMUSCULAR | Status: AC
  Administered 2024-02-08: 4 mg via INTRAVENOUS
  Filled 2024-02-08: qty 2

## 2024-02-08 MED ORDER — SODIUM CHLORIDE 0.9 % IV SOLN
25.0000 mg | Freq: Once | INTRAVENOUS | Status: AC
  Administered 2024-02-08: 25 mg via INTRAVENOUS
  Filled 2024-02-08: qty 1

## 2024-02-08 MED ORDER — PROMETHAZINE HCL 25 MG PO TABS: 25.0000 mg | ORAL_TABLET | Freq: Four times a day (QID) | ORAL | 0 refills | Status: AC | PRN

## 2024-02-08 MED ORDER — LOPERAMIDE HCL 2 MG PO CAPS
4.0000 mg | ORAL_CAPSULE | Freq: Once | ORAL | Status: AC
  Administered 2024-02-08: 4 mg via ORAL
  Filled 2024-02-08: qty 2

## 2024-02-08 MED ORDER — ONDANSETRON 4 MG PO TBDP
4.0000 mg | ORAL_TABLET | Freq: Once | ORAL | Status: AC
  Administered 2024-02-08: 4 mg via ORAL

## 2024-02-08 MED ORDER — POTASSIUM CHLORIDE 10 MEQ/100ML IV SOLN
10.0000 meq | INTRAVENOUS | Status: AC
  Administered 2024-02-08 (×2): 10 meq via INTRAVENOUS
  Filled 2024-02-08 (×2): qty 100

## 2024-02-08 MED ORDER — ONDANSETRON 4 MG PO TBDP: 4.0000 mg | ORAL_TABLET | Freq: Four times a day (QID) | ORAL | 0 refills | Status: AC | PRN

## 2024-02-08 NOTE — Discharge Instructions (Signed)
 You may alternate Tylenol 1000 mg every 6 hours as needed for pain, fever and Ibuprofen 800 mg every 6-8 hours as needed for pain, fever.  Please take Ibuprofen with food.  Do not take more than 4000 mg of Tylenol (acetaminophen) in a 24 hour period.  You may use over-the-counter Imodium as needed for diarrhea.  I recommend a bland diet for the next several days.  Please increase your fluid intake.

## 2024-02-08 NOTE — ED Triage Notes (Signed)
 Pt reports n/v/d that began earlier tonight, pt states her daughter was just recently sick with the same thing. Pt denies abd pain.

## 2024-02-08 NOTE — ED Provider Notes (Signed)
 [facility] Provider Note    Event Date/Time   First MD Initiated Contact with Patient 02/08/24 [phone]     (approximate)   History   Emesis and Diarrhea   HPI  Stephanie Marquez is a 38 y.o. female with history of kidney stones who presents to the emergency department nausea, vomiting, diarrhea that started at 10 PM last night.  States her daughter had similar symptoms.  She denies any fever.  No dysuria, hematuria.  She is on her menstrual cycle.  No abnormal vaginal discharge.  She has had previous ectopic pregnancy with unilateral salpingectomy and then subsequently tubal ligation after her last pregnancy.  She states tonight she started having cramping her extremities and everything was "curling up" and had tingling in her lips.  She also states that she felt like her respiratory rate had increased.  She is not a diabetic.   History provided by patient.    Past Medical History:  Diagnosis Date   Chronic kidney disease    Kidney stones     Past Surgical History:  Procedure Laterality Date   DILATION AND EVACUATION N/A 12/26/2016   Procedure: DILATATION AND EVACUATION;  Surgeon: Linzie Collin, MD;  Location: [facility] ORS;  Service: Gynecology;  Laterality: N/A;   ECTOPIC PREGNANCY SURGERY     TUBAL LIGATION N/A 09/22/2018   Procedure: POST PARTUM TUBAL LIGATION;  Surgeon: Linzie Collin, MD;  Location: [facility] ORS;  Service: Gynecology;  Laterality: N/A;    MEDICATIONS:  Prior to Admission medications   Medication Sig Start Date End Date Taking? Authorizing Provider  ibuprofen (ADVIL,MOTRIN) 600 MG tablet Take 1 tablet (600 mg total) by mouth every 6 (six) hours. 09/23/18   Doreene Burke, CNM  ondansetron (ZOFRAN ODT) 4 MG disintegrating tablet Take 1 tablet (4 mg total) by mouth every 8 (eight) hours as needed for nausea or vomiting. 02/24/20   Chesley Noon, MD  Prenatal Vit-Fe Fumarate-FA (PRENATAL MULTIVITAMIN) TABS tablet Take 1 tablet by mouth  daily at 12 noon.    [provider]    Physical Exam   Triage Vital Signs: ED Triage Vitals  Encounter Vitals Group     BP 02/08/24 0158 (!) 102/58     Systolic BP Percentile --      Diastolic BP Percentile --      Pulse Rate 02/08/24 0156 (!) 120     Resp 02/08/24 0156 20     Temp 02/08/24 0158 97.6 F (36.4 C)     Temp src --      SpO2 02/08/24 0156 100 %     Weight 02/08/24 0155 140 lb (63.5 kg)     Height 02/08/24 0155 5\' 2"  (1.575 m)     Head Circumference --      Peak Flow --      Pain Score 02/08/24 0155 0     Pain Loc --      Pain Education --      Exclude from Growth Chart --     Most recent vital signs: Vitals:   02/08/24 0158 02/08/24 0623  BP: (!) 102/58 112/70  Pulse:  97  Resp:  18  Temp: 97.6 F (36.4 C) 98.5 F (36.9 C)  SpO2:  100%    CONSTITUTIONAL: Alert, responds appropriately to questions. Well-appearing; well-nourished HEAD: Normocephalic, atraumatic EYES: Conjunctivae clear, pupils appear equal, sclera nonicteric ENT: normal nose; dry mucous membranes NECK: Supple, normal ROM CARD: Regular and tachycardic; S1 and S2 appreciated RESP:  Normal chest excursion without splinting or tachypnea; breath sounds clear and equal bilaterally; no wheezes, no rhonchi, no rales, no hypoxia or respiratory distress, speaking full sentences ABD/GI: Non-distended; soft, non-tender, no rebound, no guarding, no peritoneal signs, no tenderness at McBurney's point BACK: The back appears normal EXT: Normal ROM in all joints; no deformity noted, no edema SKIN: Normal color for age and race; warm; no rash on exposed skin NEURO: Moves all extremities equally, normal speech PSYCH: The patient's mood and manner are appropriate.   ED Results / Procedures / Treatments   LABS: (all labs ordered are listed, but only abnormal results are displayed) Labs Reviewed  CBC WITH DIFFERENTIAL/PLATELET - Abnormal; Notable for the following components:      Result  Value   WBC 13.1 (*)    Hemoglobin 15.2 (*)    Neutro Abs 11.2 (*)    Abs Immature Granulocytes 0.08 (*)    All other components within normal limits  BASIC METABOLIC PANEL - Abnormal; Notable for the following components:   Potassium 3.2 (*)    CO2 15 (*)    Glucose, Bld 165 (*)    Anion gap 16 (*)    All other components within normal limits  HEPATIC FUNCTION PANEL - Abnormal; Notable for the following components:   Total Protein 8.2 (*)    All other components within normal limits  URINALYSIS, ROUTINE W REFLEX MICROSCOPIC - Abnormal; Notable for the following components:   Color, Urine YELLOW (*)    APPearance CLEAR (*)    Ketones, ur 5 (*)    All other components within normal limits  BASIC METABOLIC PANEL - Abnormal; Notable for the following components:   Chloride 112 (*)    CO2 20 (*)    Glucose, Bld 106 (*)    Calcium 7.9 (*)    All other components within normal limits  RESP PANEL BY RT-PCR (RSV, FLU A&B, COVID)  RVPGX2  LIPASE, BLOOD  HCG, QUANTITATIVE, PREGNANCY  MAGNESIUM     EKG:     RADIOLOGY: My personal review and interpretation of imaging:    I have personally reviewed all radiology reports.   No results found.   PROCEDURES:  Critical Care performed: Yes, see critical care procedure note(s)   CRITICAL CARE Performed by: Rochele Raring   Total critical care time: 30 minutes  Critical care time was exclusive of separately billable procedures and treating other patients.  Critical care was necessary to treat or prevent imminent or life-threatening deterioration.  Critical care was time spent personally by me on the following activities: development of treatment plan with patient and/or surrogate as well as nursing, discussions with consultants, evaluation of patient's response to treatment, examination of patient, obtaining history from patient or surrogate, ordering and performing treatments and interventions, ordering and review of laboratory  studies, ordering and review of radiographic studies, pulse oximetry and re-evaluation of patient's condition.   Procedures    IMPRESSION / MDM / ASSESSMENT AND PLAN / ED COURSE  I reviewed the triage vital signs and the nursing notes.    Patient here with nausea, vomiting and diarrhea.  Daughter with similar symptoms.     DIFFERENTIAL DIAGNOSIS (includes but not limited to):   Viral gastroenteritis, dehydration, electrolyte derangement, doubt appendicitis, colitis, bowel obstruction, diverticulitis, perforation   Patient's presentation is most consistent with acute presentation with potential threat to life or bodily function.   PLAN: Workup initiated from triage.  Labs show leukocytosis of 13,000.  Potassium of 3.2.  Will give IV replacement.  She also has a metabolic acidosis with a bicarb of 15 and an anion gap of 16.  Blood glucose is 165.  I suspect that this is starvation ketosis from dehydration, vomiting, GI loss.  LFTs, lipase unremarkable.  Will check magnesium level, pregnancy test.  Will give IV fluids, antiemetics and p.o. challenge.   MEDICATIONS GIVEN IN ED: Medications  promethazine (PHENERGAN) 25 mg in sodium chloride 0.9 % 50 mL IVPB (25 mg Intravenous New Bag/Given 02/08/24 0649)  ondansetron (ZOFRAN-ODT) disintegrating tablet 4 mg (4 mg Oral Given 02/08/24 0200)  sodium chloride 0.9 % bolus 1,000 mL (0 mLs Intravenous Stopped 02/08/24 0545)  sodium chloride 0.9 % bolus 1,000 mL (0 mLs Intravenous Stopped 02/08/24 0545)  ondansetron (ZOFRAN) injection 4 mg (4 mg Intravenous Given 02/08/24 0438)  potassium chloride 10 mEq in 100 mL IVPB (0 mEq Intravenous Stopped 02/08/24 0651)  loperamide (IMODIUM) capsule 4 mg (4 mg Oral Given 02/08/24 0507)     ED COURSE: Magnesium level normal.  Pregnancy test negative.  Repeat BMP shows potassium is now 4.6, bicarb is up to 20 and anion gap has closed.  Urine shows small ketones but no sign of infection.  She reports feeling  much better and is tolerating p.o.  Heart rate has improved as well.  I feel she is safe for discharge.  Will discharge with antiemetics, instructions for bland diet, supportive care instructions and return precautions.  Suspect viral gastroenteritis causing symptoms today.   At this time, I do not feel there is any life-threatening condition present. I reviewed all nursing notes, vitals, pertinent previous records.  All lab and urine results, EKGs, imaging ordered have been independently reviewed and interpreted by myself.  I reviewed all available radiology reports from any imaging ordered this visit.  Based on my assessment, I feel the patient is safe to be discharged home without further emergent workup and can continue workup as an outpatient as needed. Discussed all findings, treatment plan as well as usual and customary return precautions.  They verbalize understanding and are comfortable with this plan.  Outpatient follow-up has been provided as needed.  All questions have been answered.    CONSULTS: Admission considered but patient is feeling better, tolerating p.o. and repeat blood work, vitals have improved.   OUTSIDE RECORDS REVIEWED: Reviewed last GYN notes in 2019.       FINAL CLINICAL IMPRESSION(S) / ED DIAGNOSES   Final diagnoses:  Nausea vomiting and diarrhea  Dehydration  Metabolic acidosis     Rx / DC Orders   ED Discharge Orders          Ordered    ondansetron (ZOFRAN-ODT) 4 MG disintegrating tablet  Every 6 hours PRN        02/08/24 0702    promethazine (PHENERGAN) 25 MG tablet  Every 6 hours PRN        02/08/24 0702             Note:  This document was prepared using Dragon voice recognition software and may include unintentional dictation errors.   Corsica Franson, Layla Maw, DO 02/08/24 [phone]
